# Patient Record
Sex: Male | Born: 1950
Health system: Southern US, Community
[De-identification: ages and names within clinical notes are randomized; demographics above are authoritative.]

## PROBLEM LIST (undated history)

## (undated) ENCOUNTER — Emergency Department: Admission: EM | Payer: Medicare Other

## (undated) DIAGNOSIS — M5030 Other cervical disc degeneration, unspecified cervical region: Secondary | ICD-10-CM

## (undated) DIAGNOSIS — G5690 Unspecified mononeuropathy of unspecified upper limb: Secondary | ICD-10-CM

## (undated) DIAGNOSIS — N486 Induration penis plastica: Secondary | ICD-10-CM

## (undated) DIAGNOSIS — F32A Depression, unspecified: Secondary | ICD-10-CM

## (undated) DIAGNOSIS — F329 Major depressive disorder, single episode, unspecified: Secondary | ICD-10-CM

## (undated) DIAGNOSIS — R0789 Other chest pain: Secondary | ICD-10-CM

## (undated) DIAGNOSIS — R350 Frequency of micturition: Secondary | ICD-10-CM

## (undated) DIAGNOSIS — F410 Panic disorder [episodic paroxysmal anxiety] without agoraphobia: Secondary | ICD-10-CM

## (undated) DIAGNOSIS — F39 Unspecified mood [affective] disorder: Secondary | ICD-10-CM

## (undated) DIAGNOSIS — N400 Enlarged prostate without lower urinary tract symptoms: Secondary | ICD-10-CM

## (undated) DIAGNOSIS — K621 Rectal polyp: Secondary | ICD-10-CM

## (undated) DIAGNOSIS — L723 Sebaceous cyst: Secondary | ICD-10-CM

## (undated) DIAGNOSIS — Z8619 Personal history of other infectious and parasitic diseases: Secondary | ICD-10-CM

## (undated) DIAGNOSIS — G8929 Other chronic pain: Secondary | ICD-10-CM

## (undated) DIAGNOSIS — R339 Retention of urine, unspecified: Secondary | ICD-10-CM

## (undated) DIAGNOSIS — N529 Male erectile dysfunction, unspecified: Secondary | ICD-10-CM

## (undated) HISTORY — PX: HERNIA REPAIR: SHX51

## (undated) HISTORY — DX: Unspecified mononeuropathy of unspecified upper limb: G56.90

## (undated) HISTORY — DX: Male erectile dysfunction, unspecified: N52.9

## (undated) HISTORY — DX: Other chronic pain: G89.29

## (undated) HISTORY — DX: Induration penis plastica: N48.6

## (undated) HISTORY — DX: Unspecified mood (affective) disorder: F39

## (undated) HISTORY — DX: Panic disorder (episodic paroxysmal anxiety): F41.0

## (undated) HISTORY — DX: Retention of urine, unspecified: R33.9

## (undated) HISTORY — DX: Other chest pain: R07.89

## (undated) HISTORY — DX: Other cervical disc degeneration, unspecified cervical region: M50.30

## (undated) HISTORY — DX: Major depressive disorder, single episode, unspecified: F32.9

## (undated) HISTORY — DX: Sebaceous cyst: L72.3

## (undated) HISTORY — DX: Depression, unspecified: F32.A

## (undated) HISTORY — DX: Rectal polyp: K62.1

## (undated) HISTORY — DX: Benign prostatic hyperplasia without lower urinary tract symptoms: N40.0

## (undated) HISTORY — DX: Personal history of other infectious and parasitic diseases: Z86.19

## (undated) HISTORY — PX: APPENDECTOMY: SHX54

## (undated) HISTORY — DX: Frequency of micturition: R35.0

## (undated) HISTORY — PX: CERVICAL SPINE SURGERY: SHX589

---

## 2007-04-27 ENCOUNTER — Emergency Department: Payer: Self-pay

## 2008-10-04 ENCOUNTER — Ambulatory Visit: Payer: Self-pay | Admitting: General Practice

## 2010-12-01 ENCOUNTER — Inpatient Hospital Stay: Payer: Self-pay | Admitting: Surgery

## 2010-12-02 ENCOUNTER — Inpatient Hospital Stay: Payer: Self-pay | Admitting: Surgery

## 2010-12-03 LAB — PATHOLOGY REPORT

## 2012-02-17 ENCOUNTER — Ambulatory Visit: Payer: Self-pay | Admitting: Family

## 2012-02-17 LAB — BASIC METABOLIC PANEL
Anion Gap: 6 — ABNORMAL LOW (ref 7–16)
BUN: 12 mg/dL (ref 7–18)
Chloride: 105 mmol/L (ref 98–107)
Creatinine: 0.9 mg/dL (ref 0.60–1.30)
EGFR (African American): >60
EGFR (Non-African Amer.): >60
Potassium: 4 mmol/L (ref 3.5–5.1)
Sodium: 139 mmol/L (ref 136–145)

## 2012-02-17 LAB — CBC WITH DIFFERENTIAL/PLATELET
Basophil #: 0 10*3/uL (ref 0.0–0.1)
Basophil %: 0.5 %
Eosinophil #: 0.2 10*3/uL (ref 0.0–0.7)
Eosinophil %: 2.6 %
HGB: 13.5 g/dL (ref 13.0–18.0)
Lymphocyte %: 31.8 %
MCH: 27.8 pg (ref 26.0–34.0)
MCHC: 32.9 g/dL (ref 32.0–36.0)
MCV: 85 fL (ref 80–100)
Monocyte #: 0.7 x10 3/mm (ref 0.2–1.0)
Monocyte %: 8.3 %
Neutrophil #: 4.9 10*3/uL (ref 1.4–6.5)
Neutrophil %: 56.8 %
Platelet: 283 10*3/uL (ref 150–440)
RBC: 4.85 10*6/uL (ref 4.40–5.90)
WBC: 8.7 10*3/uL (ref 3.8–10.6)

## 2012-02-17 LAB — LIPID PANEL
HDL Cholesterol: 50 mg/dL (ref 40–60)
Triglycerides: 101 mg/dL (ref 0–200)

## 2012-02-18 LAB — PSA: PSA: 1.4 ng/mL (ref 0.0–4.0)

## 2012-05-15 ENCOUNTER — Emergency Department: Payer: Self-pay | Admitting: Emergency Medicine

## 2012-09-03 ENCOUNTER — Emergency Department: Payer: Self-pay | Admitting: Unknown Physician Specialty

## 2012-09-03 LAB — COMPREHENSIVE METABOLIC PANEL
Albumin: 3.1 g/dL — ABNORMAL LOW (ref 3.4–5.0)
Anion Gap: 9 (ref 7–16)
BUN: 13 mg/dL (ref 7–18)
Bilirubin,Total: 0.2 mg/dL (ref 0.2–1.0)
Calcium, Total: 8.2 mg/dL — ABNORMAL LOW (ref 8.5–10.1)
EGFR (African American): >60
Glucose: 98 mg/dL (ref 65–99)
Potassium: 3.8 mmol/L (ref 3.5–5.1)
SGOT(AST): 45 U/L — ABNORMAL HIGH (ref 15–37)
SGPT (ALT): 62 U/L (ref 12–78)
Sodium: 140 mmol/L (ref 136–145)
Total Protein: 6.7 g/dL (ref 6.4–8.2)

## 2012-09-03 LAB — TROPONIN I: Troponin-I: <0.02 ng/mL

## 2012-09-03 LAB — CBC
MCHC: 33.4 g/dL (ref 32.0–36.0)
Platelet: 184 10*3/uL (ref 150–440)
RDW: 15.9 % — ABNORMAL HIGH (ref 11.5–14.5)
WBC: 8.1 10*3/uL (ref 3.8–10.6)

## 2012-12-12 DIAGNOSIS — F39 Unspecified mood [affective] disorder: Secondary | ICD-10-CM

## 2012-12-12 DIAGNOSIS — G5690 Unspecified mononeuropathy of unspecified upper limb: Secondary | ICD-10-CM

## 2012-12-12 DIAGNOSIS — M5030 Other cervical disc degeneration, unspecified cervical region: Secondary | ICD-10-CM

## 2012-12-12 HISTORY — DX: Unspecified mood (affective) disorder: F39

## 2012-12-12 HISTORY — DX: Unspecified mononeuropathy of unspecified upper limb: G56.90

## 2012-12-12 HISTORY — DX: Other cervical disc degeneration, unspecified cervical region: M50.30

## 2013-04-18 DIAGNOSIS — F410 Panic disorder [episodic paroxysmal anxiety] without agoraphobia: Secondary | ICD-10-CM

## 2013-04-18 HISTORY — DX: Panic disorder (episodic paroxysmal anxiety): F41.0

## 2013-04-28 IMAGING — CT CT HEAD WITHOUT CONTRAST
1 series · 16 of 30 positions shown, 20 images · non-contrast
Comparison: none

REASON FOR EXAM: syncope
COMMENTS:

PROCEDURE:     CT  - CT HEAD WITHOUT CONTRAST  - September 03, 2012 [DATE]
RESULT:     Comparison:  None
TECHNIQUE: Multiple axial images from the foramen magnum to the vertex were
obtained without IV contrast.

[Series 2: soft tissue · axial · 0.42mm/px · z∈[+246,+380]mm · 16 of 30 slices shown, 20 images]
[im 2/30  brain]
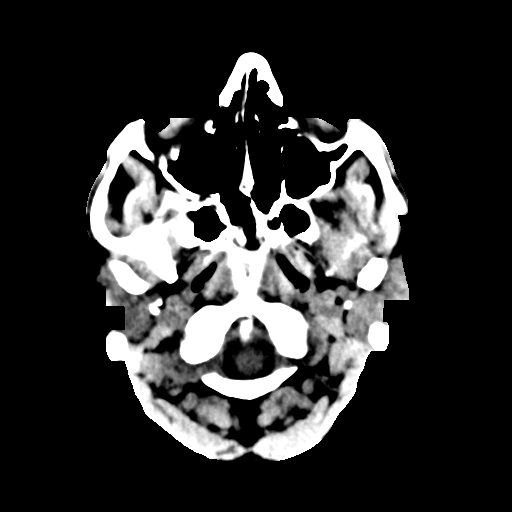
[im 2/30  bone]
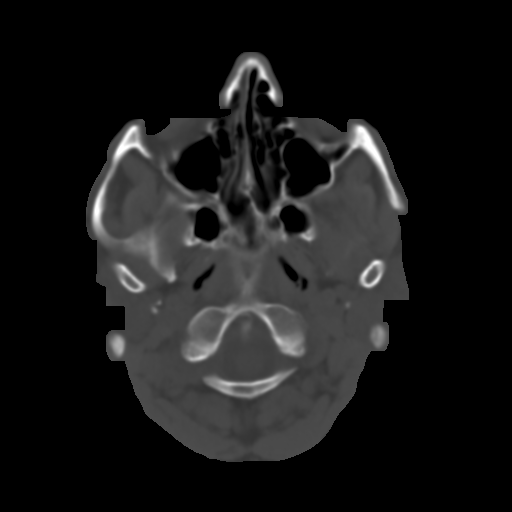
[im 4/30  brain]
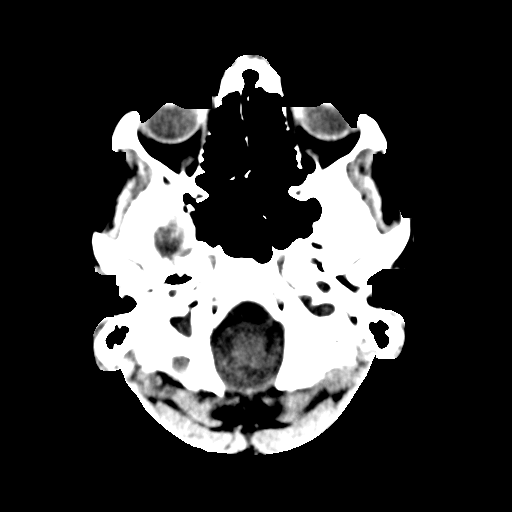
[im 6/30  brain]
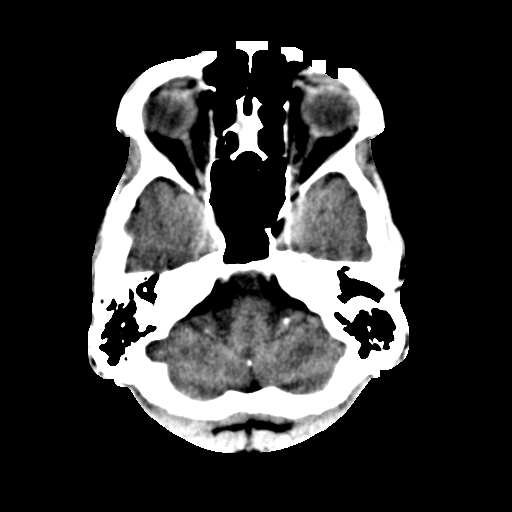
[im 8/30  brain]
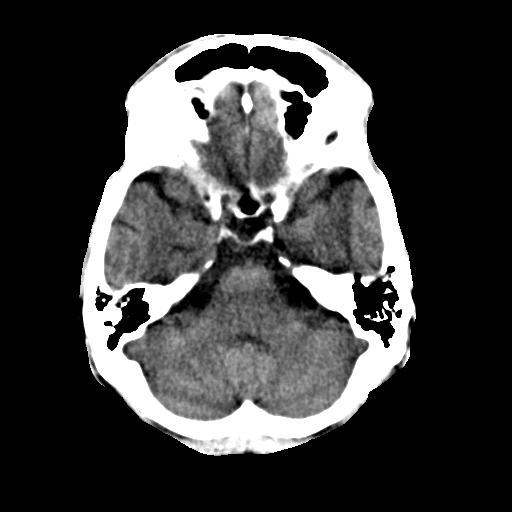
[im 9/30  brain]
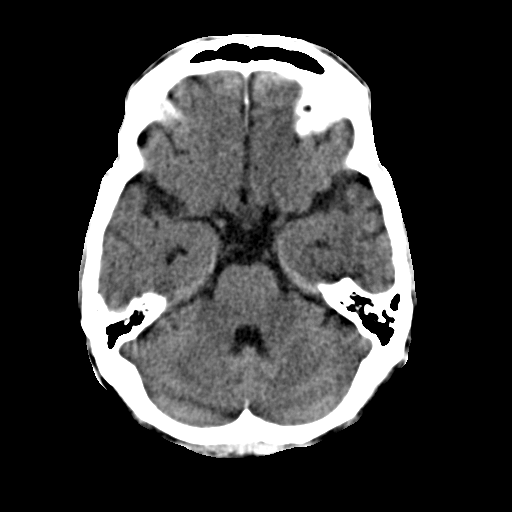
[im 9/30  bone]
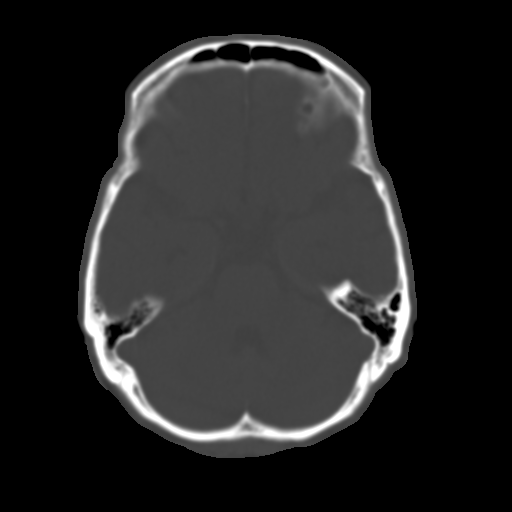
[im 11/30  brain]
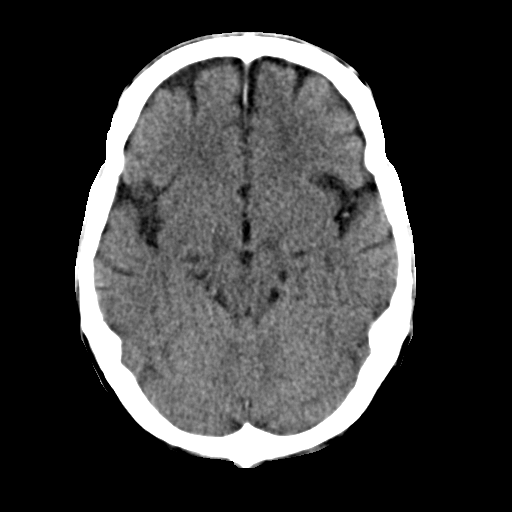
[im 13/30  brain]
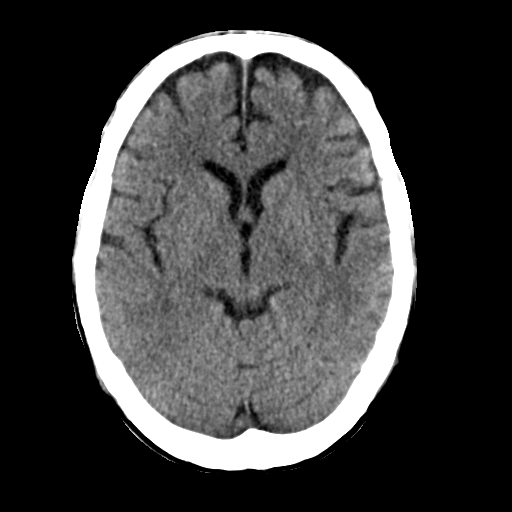
[im 15/30  brain]
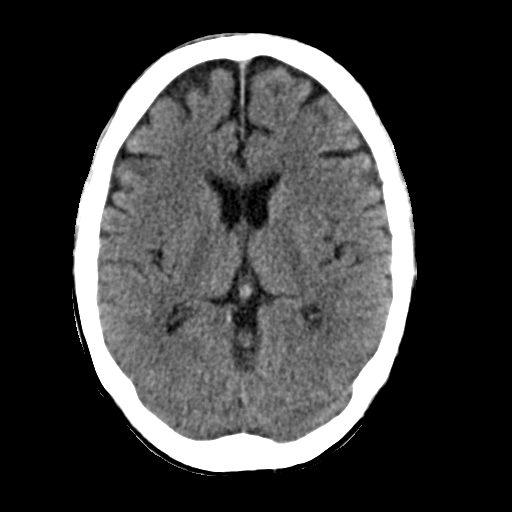
[im 16/30  brain]
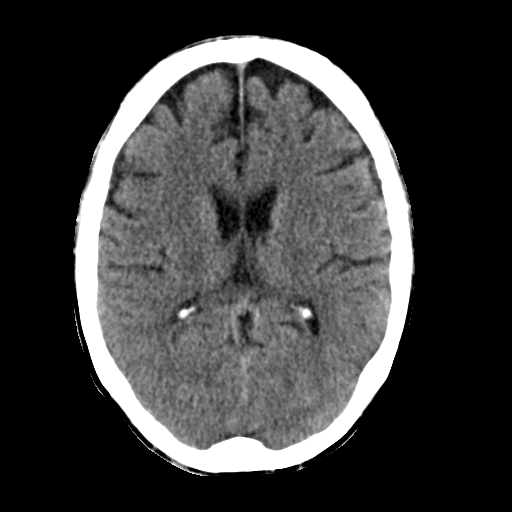
[im 16/30  bone]
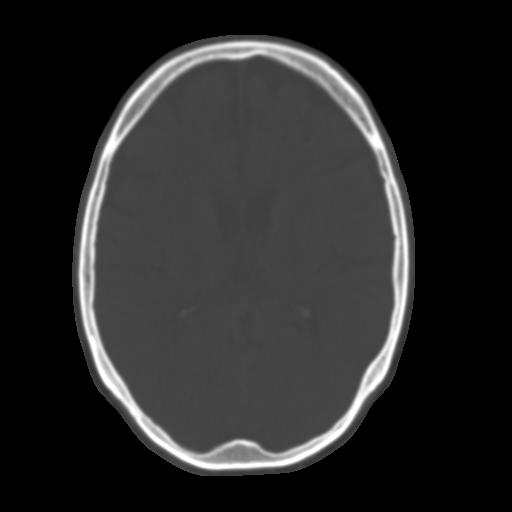
[im 18/30  brain]
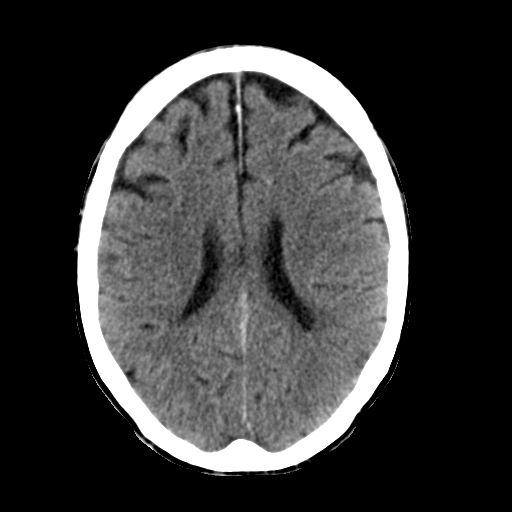
[im 20/30  brain]
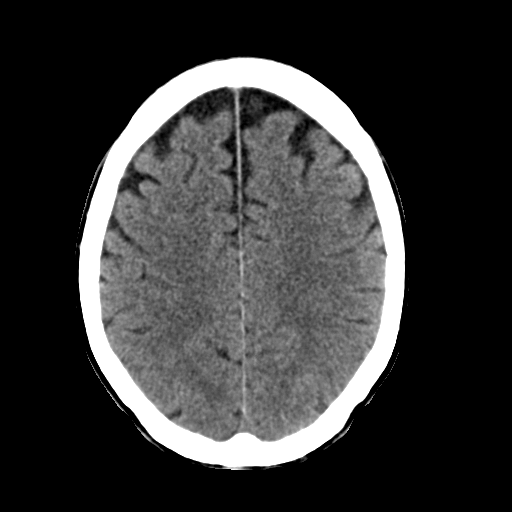
[im 22/30  brain]
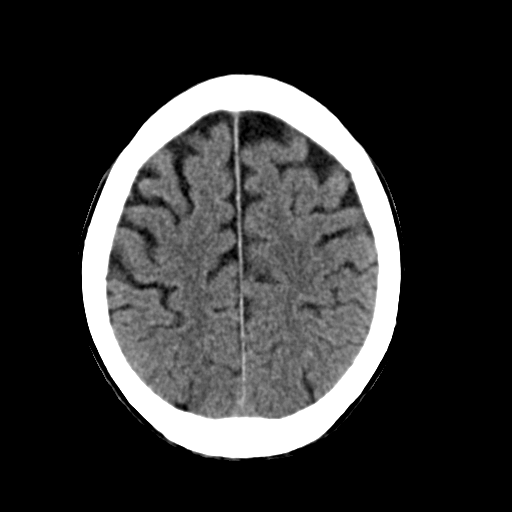
[im 23/30  brain]
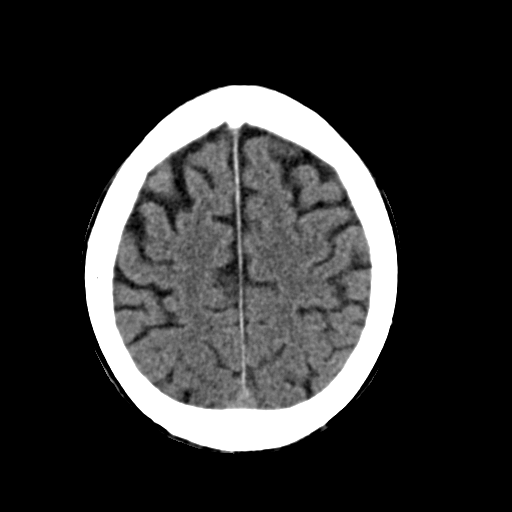
[im 23/30  bone]
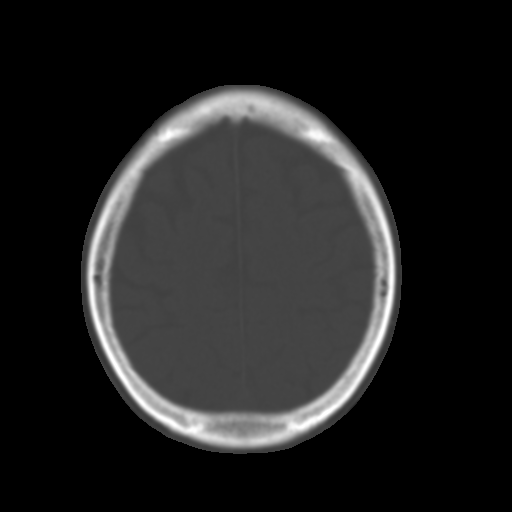
[im 25/30  brain]
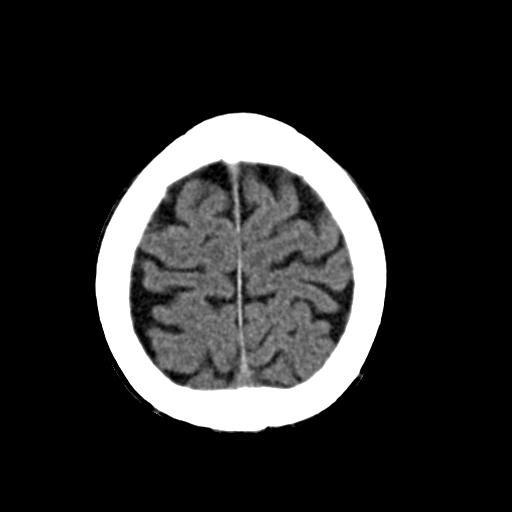
[im 27/30  brain]
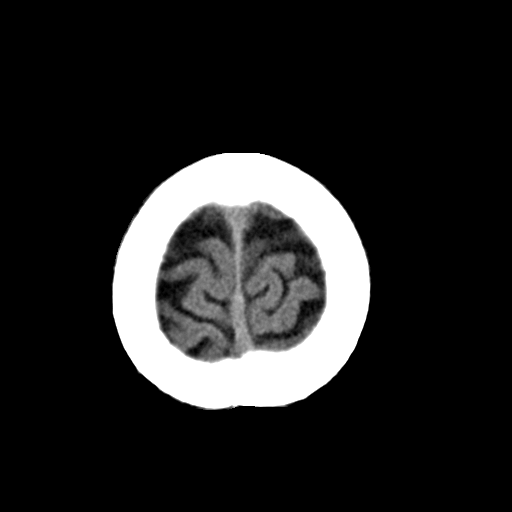
[im 29/30  brain]
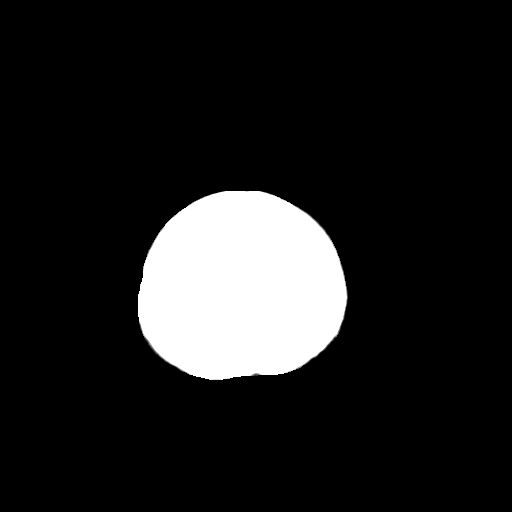

[16 of 30 positions shown; findings below may reference images not displayed]

FINDINGS: There is no evidence of mass effect, midline shift, or extra-axial fluid
collections.  There is no evidence of a space-occupying lesion or
intracranial hemorrhage. There is no evidence of a cortical-based area of
acute infarction. There is generalized cerebral atrophy.

The ventricles and sulci are appropriate for the patient's age. The basal
cisterns are patent.

Visualized portions of the orbits are unremarkable. The visualized portions
of the paranasal sinuses and mastoid air cells are unremarkable.

The osseous structures are unremarkable.
IMPRESSION: No acute intracranial process.

[REDACTED]

## 2013-07-10 DIAGNOSIS — F329 Major depressive disorder, single episode, unspecified: Secondary | ICD-10-CM | POA: Insufficient documentation

## 2013-07-10 HISTORY — DX: Major depressive disorder, single episode, unspecified: F32.9

## 2014-01-23 ENCOUNTER — Encounter: Payer: Self-pay | Admitting: General Surgery

## 2014-01-31 ENCOUNTER — Ambulatory Visit: Payer: Self-pay | Admitting: Gastroenterology

## 2014-02-04 ENCOUNTER — Ambulatory Visit: Payer: Self-pay | Admitting: General Surgery

## 2014-02-21 ENCOUNTER — Ambulatory Visit: Payer: Self-pay | Admitting: Gastroenterology

## 2014-02-21 LAB — CBC WITH DIFFERENTIAL/PLATELET
BASOS PCT: 1.2 %
Basophil #: 0.1 10*3/uL (ref 0.0–0.1)
Eosinophil #: 0.3 10*3/uL (ref 0.0–0.7)
Eosinophil %: 2.7 %
HCT: 38.1 % — ABNORMAL LOW (ref 40.0–52.0)
HGB: 11.9 g/dL — AB (ref 13.0–18.0)
LYMPHS PCT: 36.8 %
Lymphocyte #: 3.7 10*3/uL — ABNORMAL HIGH (ref 1.0–3.6)
MCH: 21.9 pg — AB (ref 26.0–34.0)
MCHC: 31.1 g/dL — AB (ref 32.0–36.0)
MCV: 70 fL — ABNORMAL LOW (ref 80–100)
Monocyte #: 0.8 x10 3/mm (ref 0.2–1.0)
Monocyte %: 8.3 %
NEUTROS ABS: 5.1 10*3/uL (ref 1.4–6.5)
NEUTROS PCT: 51 %
Platelet: 295 10*3/uL (ref 150–440)
RBC: 5.41 10*6/uL (ref 4.40–5.90)
RDW: 18.9 % — AB (ref 11.5–14.5)
WBC: 10 10*3/uL (ref 3.8–10.6)

## 2014-02-21 LAB — HEPATIC FUNCTION PANEL A (ARMC)
ALK PHOS: 66 U/L
Albumin: 4 g/dL (ref 3.4–5.0)
Bilirubin, Direct: <0.05 mg/dL (ref 0.00–0.20)
Bilirubin,Total: 0.4 mg/dL (ref 0.2–1.0)
SGOT(AST): 37 U/L (ref 15–37)
SGPT (ALT): 62 U/L (ref 12–78)
Total Protein: 8.2 g/dL (ref 6.4–8.2)

## 2014-02-21 LAB — PROTIME-INR
INR: 0.8
Prothrombin Time: 11.4 secs — ABNORMAL LOW (ref 11.5–14.7)

## 2014-02-21 LAB — IRON AND TIBC
Iron Bind.Cap.(Total): 669 ug/dL — ABNORMAL HIGH (ref 250–450)
Iron Saturation: 3 %
Iron: 23 ug/dL — ABNORMAL LOW (ref 65–175)
UNBOUND IRON-BIND. CAP.: 646 ug/dL

## 2014-02-21 LAB — FERRITIN: FERRITIN (ARMC): 9 ng/mL (ref 8–388)

## 2014-02-22 ENCOUNTER — Ambulatory Visit: Payer: Self-pay | Admitting: Gastroenterology

## 2014-05-14 ENCOUNTER — Ambulatory Visit: Payer: Self-pay | Admitting: Gastroenterology

## 2014-05-14 LAB — HEPATIC FUNCTION PANEL A (ARMC)
ALBUMIN: 4.1 g/dL (ref 3.4–5.0)
AST: 14 U/L — AB (ref 15–37)
Alkaline Phosphatase: 58 U/L
BILIRUBIN DIRECT: 0.1 mg/dL (ref 0.00–0.20)
BILIRUBIN TOTAL: 0.3 mg/dL (ref 0.2–1.0)
SGPT (ALT): 18 U/L (ref 12–78)
TOTAL PROTEIN: 8 g/dL (ref 6.4–8.2)

## 2014-09-17 ENCOUNTER — Ambulatory Visit: Payer: Self-pay | Admitting: Family Medicine

## 2014-10-31 DIAGNOSIS — N486 Induration penis plastica: Secondary | ICD-10-CM | POA: Diagnosis not present

## 2014-11-27 DIAGNOSIS — N486 Induration penis plastica: Secondary | ICD-10-CM | POA: Diagnosis not present

## 2015-01-14 DIAGNOSIS — N400 Enlarged prostate without lower urinary tract symptoms: Secondary | ICD-10-CM | POA: Diagnosis not present

## 2015-01-14 DIAGNOSIS — N529 Male erectile dysfunction, unspecified: Secondary | ICD-10-CM | POA: Diagnosis not present

## 2015-02-11 DIAGNOSIS — F0630 Mood disorder due to known physiological condition, unspecified: Secondary | ICD-10-CM | POA: Diagnosis not present

## 2015-02-11 DIAGNOSIS — M5030 Other cervical disc degeneration, unspecified cervical region: Secondary | ICD-10-CM | POA: Diagnosis not present

## 2015-02-11 DIAGNOSIS — G5692 Unspecified mononeuropathy of left upper limb: Secondary | ICD-10-CM | POA: Diagnosis not present

## 2015-03-07 DIAGNOSIS — N486 Induration penis plastica: Secondary | ICD-10-CM | POA: Diagnosis not present

## 2015-04-02 ENCOUNTER — Telehealth: Payer: Self-pay | Admitting: Urology

## 2015-04-02 NOTE — Telephone Encounter (Signed)
Spoke with pt wife in reference to pt soreness. Reinforced to wife during the xiaflex procedure time frame soreness is to be expected. Wife voiced understanding. Cw,lpn

## 2015-04-02 NOTE — Telephone Encounter (Signed)
Called pt to reschedule 2 appointments in June with Dr.Hart (6/24 &6/27). Pts then informed me pain. Not in the penis but in the hairlines of the penis. Says its sore like a bruise. He's wanting to know if this is normal since this is his 3rd injection. Best contact # [phone] [facility] 04/02/15

## 2015-04-29 ENCOUNTER — Encounter: Payer: Self-pay | Admitting: Urology

## 2015-04-29 ENCOUNTER — Ambulatory Visit: Payer: Self-pay | Admitting: Urology

## 2015-04-29 ENCOUNTER — Ambulatory Visit (INDEPENDENT_AMBULATORY_CARE_PROVIDER_SITE_OTHER): Payer: Self-pay | Admitting: Urology

## 2015-04-29 VITALS — BP 135/82 | HR 87 | Ht 67.0 in | Wt 128.0 lb

## 2015-04-29 DIAGNOSIS — N529 Male erectile dysfunction, unspecified: Secondary | ICD-10-CM

## 2015-04-29 DIAGNOSIS — N528 Other male erectile dysfunction: Secondary | ICD-10-CM

## 2015-04-29 MED ORDER — SILDENAFIL CITRATE 100 MG PO TABS: 100.0000 mg | ORAL_TABLET | ORAL | Status: DC | PRN

## 2015-04-29 NOTE — Progress Notes (Signed)
04/29/2015 5:15 PM   Dalton Fleming 03/18/1951 [id number]  Referring provider: No referring provider defined for this encounter.  Chief Complaint  Patient presents with  . Abnormal Penile Curvature    follow up    HPI: Patient had 1 treatment with X IIA FLEX approximately 6 weeks ago. The interim he is done molding. His wife run a picture of his erect penis today. Penis was perfectly straight and no longer had a 30 bend which she had had prior to the treatment with the X IIA FLEX'and molding. Patient still complains of inability ordered sildenafil E milligrams 1:30 minutes before intercourse. This generic should cost him less than Viagra she does not have the money to pay for. He is complaining of inability to hold erection and retrograde ejaculation. I will stop his Flomax and continue his finasteride stopping Flomax should help him with his retrograde ejaculation   PMH: No past medical history on file.  Surgical History: No past surgical history on file.  Home Medications:    Medication List       This list is accurate as of: 04/29/15  5:15 PM.  Always use your most recent med list.               ALPRAZolam 1 MG tablet  Commonly known as:  XANAX  Take 1 tablet every 6 hours as needed for anxiety and sleep. Max 3 tabs a day     aspirin EC 81 MG tablet  Take by mouth.     finasteride 5 MG tablet  Commonly known as:  PROSCAR     HYDROmorphone 4 MG tablet  Commonly known as:  DILAUDID  1 tab Q 6 hrs, 4 per day     lamoTRIgine 100 MG tablet  Commonly known as:  LAMICTAL  1 po Q AM and Q PM     meloxicam 15 MG tablet  Commonly known as:  MOBIC     mirtazapine 15 MG tablet  Commonly known as:  REMERON  1/2 tab BID and 1 tab HS, 2 per day     polyethylene glycol powder powder  Commonly known as:  GLYCOLAX/MIRALAX  Take by mouth.     sertraline 100 MG tablet  Commonly known as:  ZOLOFT  Take by mouth.     SUPREP BOWEL PREP Soln  Generic drug:  Na  Sulfate-K Sulfate-Mg Sulf     tadalafil 5 MG tablet  Commonly known as:  CIALIS  Take by mouth.     tamsulosin 0.4 MG Caps capsule  Commonly known as:  FLOMAX  Take by mouth.        Allergies: Not on File  Family History: No family history on file.  Social History:  reports that he has been smoking.  He does not have any smokeless tobacco history on file. His alcohol and drug histories are not on file.  NWG:NFAOZHYROS:UROLOGY Frequent Urination?: No Hard to postpone urination?: No Burning/pain with urination?: No Get up at night to urinate?: Yes Leakage of urine?: No Urine stream starts and stops?: Yes Trouble starting stream?: Yes Do you have to strain to urinate?: No Blood in urine?: No Urinary tract infection?: No Sexually transmitted disease?: No Injury to kidneys or bladder?: No Painful intercourse?: No Weak stream?: No Erection problems?: Yes Penile pain?: No Gastrointestinal Nausea?: No Vomiting?: No Indigestion/heartburn?: No Diarrhea?: No Constipation?: No Constitutional Fever: No Night sweats?: No Weight loss?: No Fatigue?: No Skin Skin rash/lesions?: No Itching?: No Eyes Blurred vision?:  No Double vision?: No Ears/Nose/Throat Sore throat?: No Sinus problems?: No Hematologic/Lymphatic Swollen glands?: No Easy bruising?: No Cardiovascular Leg swelling?: No Chest pain?: No Respiratory Cough?: No Shortness of breath?: No Endocrine Excessive thirst?: No Musculoskeletal Back pain?: Yes Joint pain?: No Neurological Headaches?: No Dizziness?: No Psychologic Depression?: Yes Anxiety?: Yes    Urological Symptom Review  Patient is experiencing the following symptoms:       Physical Exam: BP 135/82 mmHg  Pulse 87  Ht  (1.702 m)  Wt 128 lb (58.06 kg)  BMI 20.04 kg/m2  Constitutional:  Alert and oriented, No acute distress. HEENT: [place] AT, moist mucus membranes.  Trachea midline, no masses. Cardiovascular: No clubbing, cyanosis, or  edema. Respiratory: Normal respiratory effort, no increased work of breathing. GI: Abdomen is soft, nontender, nondistended, no abdominal masses GU: No CVA tenderness. By picture and normal erection Skin: No rashes, bruises or suspicious lesions. Lymph: No cervical or inguinal adenopathy. Neurologic: Grossly intact, no focal deficits, moving all 4 extremities. Psychiatric: Normal mood and affect.  Laboratory Data: Lab Results  Component Value Date   WBC 10.0 02/21/2014   HGB 11.9* 02/21/2014   HCT 38.1* 02/21/2014   MCV 70* 02/21/2014   PLT 295 02/21/2014    Lab Results  Component Value Date   CREATININE 0.81 09/03/2012    Lab Results  Component Value Date   PSA 1.4 02/17/2012    No results found for: TESTOSTERONE  No results found for: HGBA1C  Urinalysis No results found for: COLORURINE, APPEARANCEUR, LABSPEC, PHURINE, GLUCOSEU, HGBUR, BILIRUBINUR, KETONESUR, PROTEINUR, UROBILINOGEN, NITRITE, LEUKOCYTESUR  Pertinent Imaging: None  Assessment & Plan:  Peroneal disease resolved. No further XIAFLEX injections necessary. Follow-up is in 3 months.  There are no diagnoses linked to this encounter.  No Follow-up on file.  Lorraine Lax, MD  [facility] [address] [place], [place] [address] [phone]

## 2015-04-30 ENCOUNTER — Other Ambulatory Visit: Payer: Self-pay | Admitting: Family Medicine

## 2015-04-30 DIAGNOSIS — N529 Male erectile dysfunction, unspecified: Secondary | ICD-10-CM

## 2015-04-30 MED ORDER — SILDENAFIL CITRATE 20 MG PO TABS: ORAL_TABLET | ORAL | Status: DC

## 2015-05-09 ENCOUNTER — Ambulatory Visit: Payer: Self-pay | Admitting: Urology

## 2015-05-12 ENCOUNTER — Ambulatory Visit: Payer: Self-pay | Admitting: Urology

## 2015-05-13 ENCOUNTER — Ambulatory Visit: Payer: Self-pay | Admitting: Urology

## 2015-05-13 DIAGNOSIS — M5030 Other cervical disc degeneration, unspecified cervical region: Secondary | ICD-10-CM | POA: Diagnosis not present

## 2015-05-13 DIAGNOSIS — Z79891 Long term (current) use of opiate analgesic: Secondary | ICD-10-CM | POA: Diagnosis not present

## 2015-05-13 DIAGNOSIS — G5692 Unspecified mononeuropathy of left upper limb: Secondary | ICD-10-CM | POA: Diagnosis not present

## 2015-05-13 DIAGNOSIS — F331 Major depressive disorder, recurrent, moderate: Secondary | ICD-10-CM | POA: Diagnosis not present

## 2015-05-14 ENCOUNTER — Ambulatory Visit: Payer: Self-pay | Admitting: Urology

## 2015-05-16 ENCOUNTER — Ambulatory Visit: Payer: Self-pay | Admitting: Urology

## 2015-06-03 ENCOUNTER — Ambulatory Visit (INDEPENDENT_AMBULATORY_CARE_PROVIDER_SITE_OTHER): Payer: Medicare Other | Admitting: Urology

## 2015-06-03 ENCOUNTER — Encounter: Payer: Self-pay | Admitting: Urology

## 2015-06-03 VITALS — BP 123/73 | HR 81 | Ht 67.0 in | Wt 137.6 lb

## 2015-06-03 DIAGNOSIS — N486 Induration penis plastica: Secondary | ICD-10-CM | POA: Diagnosis not present

## 2015-06-03 NOTE — Progress Notes (Signed)
06/03/2015 11:10 AM   Dalton Fleming Dalton Fleming 11-16-1950 [id number]  Referring provider: No referring provider defined for this encounter.  Chief Complaint  Patient presents with  . Erectile Dysfunction    HPI: Patient has recurrence of 30 bend with significant upward traction. He needs another treatment. He had been straight 2 months this is a recent recurrence. Patient also complains of some difficulty with orgasm even though he can maintain an erection for some time. He does not ejaculate because he has no orgasm he has tried multiple stimulated over-the-counter products. These have not been successful HPI   PMH: Past Medical History  Diagnosis Date  . Depression   . Hx of hepatitis C   . BPH (benign prostatic hyperplasia)   . Peyronie's disease   . Inability to urinate   . Impotence   . Urinary frequency     Surgical History: Past Surgical History  Procedure Laterality Date  . Appendectomy    . Cervical spine surgery    . Hernia repair      Home Medications:    Medication List       This list is accurate as of: 06/03/15 11:10 AM.  Always use your most recent med list.               ALPRAZolam 1 MG tablet  Commonly known as:  XANAX  Take 1 tablet every 6 hours as needed for anxiety and sleep. Max 3 tabs a day     aspirin EC 81 MG tablet  Take by mouth.     finasteride 5 MG tablet  Commonly known as:  PROSCAR     HYDROmorphone 4 MG tablet  Commonly known as:  DILAUDID  1 tab Q 6 hrs, 4 per day     lamoTRIgine 100 MG tablet  Commonly known as:  LAMICTAL  1 po Q AM and Q PM     meloxicam 15 MG tablet  Commonly known as:  MOBIC     mirtazapine 15 MG tablet  Commonly known as:  REMERON  1/2 tab BID and 1 tab HS, 2 per day     polyethylene glycol powder powder  Commonly known as:  GLYCOLAX/MIRALAX  Take by mouth.     sertraline 100 MG tablet  Commonly known as:  ZOLOFT  Take by mouth.     sildenafil 100 MG tablet  Commonly known as:  VIAGRA    Take 1 tablet (100 mg total) by mouth as needed for erectile dysfunction (1/2 tab).     SUPREP BOWEL PREP Soln  Generic drug:  Na Sulfate-K Sulfate-Mg Sulf        Allergies:  Allergies  Allergen Reactions  . Codeine Rash    Other reaction(s): Vomiting    Family History: Family History  Problem Relation Age of Onset  . Heart disease Father   . Breast cancer Sister   . Prostate cancer Neg Hx     Social History:  reports that he has been smoking.  He does not have any smokeless tobacco history on file. He reports that he does not drink alcohol or use illicit drugs.  ROS: UROLOGY Frequent Urination?: No Hard to postpone urination?: No Burning/pain with urination?: No Get up at night to urinate?: Yes Leakage of urine?: No Urine stream starts and stops?: No Trouble starting stream?: No Do you have to strain to urinate?: Yes Blood in urine?: No Urinary tract infection?: No Sexually transmitted disease?: No Injury to kidneys or bladder?: No  Painful intercourse?: No Weak stream?: No Erection problems?: Yes Penile pain?: No  Gastrointestinal Nausea?: No Vomiting?: No Indigestion/heartburn?: No Diarrhea?: No Constipation?: No  Constitutional Fever: No Night sweats?: No Weight loss?: No Fatigue?: No  Skin Skin rash/lesions?: No Itching?: No  Eyes Blurred vision?: No Double vision?: No  Ears/Nose/Throat Sore throat?: No Sinus problems?: No  Hematologic/Lymphatic Swollen glands?: No Easy bruising?: No  Cardiovascular Leg swelling?: No Chest pain?: No  Respiratory Cough?: No Shortness of breath?: No  Endocrine Excessive thirst?: No  Musculoskeletal Back pain?: Yes Joint pain?: No  Neurological Headaches?: No Dizziness?: No  Psychologic Depression?: Yes Anxiety?: Yes  Physical Exam: BP 123/73 mmHg  Pulse 81  Ht 5\' 7"  (1.702 m)  Wt 137 lb 9.6 oz (62.415 kg)  BMI 21.55 kg/m2  Constitutional:  Alert and oriented, No acute  distress. HEENT: [place] AT, moist mucus membranes.  Trachea midline, no masses. Cardiovascular: No clubbing, cyanosis, or edema. Respiratory: Normal respiratory effort, no increased work of breathing. GI: Abdomen is soft, nontender, nondistended, no abdominal masses GU: No CVA tenderness. Patient has a normal-appearing flaccid penis but the corpora shows the plaque Skin: No rashes, bruises or suspicious lesions. Lymph: No cervical or inguinal adenopathy. Neurologic: Grossly intact, no focal deficits, moving all 4 extremities. Psychiatric: Normal mood and affect.  Laboratory Data: Lab Results  Component Value Date   WBC 10.0 02/21/2014   HGB 11.9* 02/21/2014   HCT 38.1* 02/21/2014   MCV 70* 02/21/2014   PLT 295 02/21/2014    Lab Results  Component Value Date   CREATININE 0.81 09/03/2012    Lab Results  Component Value Date   PSA 1.4 02/17/2012    No results found for: TESTOSTERONE  No results found for: HGBA1C  Urinalysis No results found for: COLORURINE, APPEARANCEUR, LABSPEC, PHURINE, GLUCOSEU, HGBUR, BILIRUBINUR, KETONESUR, PROTEINUR, UROBILINOGEN, NITRITE, LEUKOCYTESUR  Pertinent Imaging: Reviewed picture of patient's prone is disease. This was taken by his wife shows a 30 upward bend which is no different than previous bend. She did not save the picture showing a straight penis. Assessment and Plan: Peyroniesdisease with significant 30 ventral bend with significant PEYONIES  recurrence plan is X IIA FLEX that his  treatment. We'll set      Problem List Items Addressed This Visit    None      No Follow-up on file.  Lorraine Lax, MD  [facility] [address], [address] [place], [place] [address] [phone]

## 2015-06-16 ENCOUNTER — Other Ambulatory Visit: Payer: Self-pay | Admitting: Family Medicine

## 2015-06-16 MED ORDER — MELOXICAM 15 MG PO TABS: 15.0000 mg | ORAL_TABLET | Freq: Every day | ORAL | Status: DC

## 2015-08-06 DIAGNOSIS — F418 Other specified anxiety disorders: Secondary | ICD-10-CM | POA: Diagnosis not present

## 2015-08-06 DIAGNOSIS — F5105 Insomnia due to other mental disorder: Secondary | ICD-10-CM | POA: Diagnosis not present

## 2015-08-06 DIAGNOSIS — F331 Major depressive disorder, recurrent, moderate: Secondary | ICD-10-CM | POA: Diagnosis not present

## 2015-08-06 DIAGNOSIS — F410 Panic disorder [episodic paroxysmal anxiety] without agoraphobia: Secondary | ICD-10-CM | POA: Diagnosis not present

## 2015-08-06 DIAGNOSIS — M5030 Other cervical disc degeneration, unspecified cervical region: Secondary | ICD-10-CM | POA: Diagnosis not present

## 2015-08-06 DIAGNOSIS — G5692 Unspecified mononeuropathy of left upper limb: Secondary | ICD-10-CM | POA: Diagnosis not present

## 2015-10-30 DIAGNOSIS — G5692 Unspecified mononeuropathy of left upper limb: Secondary | ICD-10-CM | POA: Diagnosis not present

## 2015-10-30 DIAGNOSIS — M5030 Other cervical disc degeneration, unspecified cervical region: Secondary | ICD-10-CM | POA: Diagnosis not present

## 2015-12-25 ENCOUNTER — Other Ambulatory Visit: Payer: Self-pay

## 2015-12-25 DIAGNOSIS — N400 Enlarged prostate without lower urinary tract symptoms: Secondary | ICD-10-CM

## 2015-12-25 MED ORDER — TAMSULOSIN HCL 0.4 MG PO CAPS: 0.4000 mg | ORAL_CAPSULE | Freq: Every day | ORAL | Status: DC

## 2015-12-25 MED ORDER — FINASTERIDE 5 MG PO TABS: 5.0000 mg | ORAL_TABLET | Freq: Every day | ORAL | Status: DC

## 2015-12-25 NOTE — Progress Notes (Signed)
Pt pharmacy sent a refill request for flomax and finasteride. Made pt aware needs a yearly f/u for medications. Pt voiced understanding. Pt stated that he would call back later to make f/u appt. 30 days with no refills were given to pt.

## 2015-12-26 ENCOUNTER — Telehealth: Payer: Self-pay

## 2015-12-26 DIAGNOSIS — N400 Enlarged prostate without lower urinary tract symptoms: Secondary | ICD-10-CM

## 2015-12-26 MED ORDER — FINASTERIDE 5 MG PO TABS: 5.0000 mg | ORAL_TABLET | Freq: Every day | ORAL | Status: DC

## 2015-12-26 NOTE — Telephone Encounter (Signed)
Pt pharmacy sent a refill of finasteride. I dont see where pt was taken medication. Please advise.

## 2015-12-26 NOTE — Telephone Encounter (Signed)
Spoke with pt in reference to finasteride. Made pt aware he needs a f/u appt. Pt stated he currently has a very busy schedule and will have to call back. Made pt aware only 30 days is being given with no refills. Pt voiced understanding.

## 2015-12-26 NOTE — Telephone Encounter (Signed)
It was listed as medication in the meds list of Dr. Scheryl DarterHart's notes, but those notes were for xiaflex. Ok to give him a month and have him follow up in the interim to discuss this medication.

## 2016-01-01 DIAGNOSIS — G5692 Unspecified mononeuropathy of left upper limb: Secondary | ICD-10-CM | POA: Diagnosis not present

## 2016-01-01 DIAGNOSIS — M5030 Other cervical disc degeneration, unspecified cervical region: Secondary | ICD-10-CM | POA: Diagnosis not present

## 2016-01-01 DIAGNOSIS — F331 Major depressive disorder, recurrent, moderate: Secondary | ICD-10-CM | POA: Diagnosis not present

## 2016-01-01 DIAGNOSIS — F410 Panic disorder [episodic paroxysmal anxiety] without agoraphobia: Secondary | ICD-10-CM | POA: Diagnosis not present

## 2016-04-01 ENCOUNTER — Other Ambulatory Visit: Payer: Self-pay

## 2016-04-01 DIAGNOSIS — N400 Enlarged prostate without lower urinary tract symptoms: Secondary | ICD-10-CM

## 2016-04-01 MED ORDER — TAMSULOSIN HCL 0.4 MG PO CAPS: 0.4000 mg | ORAL_CAPSULE | Freq: Every day | ORAL | Status: DC

## 2016-04-13 DIAGNOSIS — M5030 Other cervical disc degeneration, unspecified cervical region: Secondary | ICD-10-CM | POA: Diagnosis not present

## 2016-04-13 DIAGNOSIS — F331 Major depressive disorder, recurrent, moderate: Secondary | ICD-10-CM | POA: Diagnosis not present

## 2016-04-13 DIAGNOSIS — G5692 Unspecified mononeuropathy of left upper limb: Secondary | ICD-10-CM | POA: Diagnosis not present

## 2016-05-26 ENCOUNTER — Other Ambulatory Visit: Payer: Self-pay | Admitting: Urology

## 2016-05-26 DIAGNOSIS — N400 Enlarged prostate without lower urinary tract symptoms: Secondary | ICD-10-CM

## 2016-06-09 ENCOUNTER — Encounter: Payer: Self-pay | Admitting: *Deleted

## 2016-06-09 DIAGNOSIS — L723 Sebaceous cyst: Secondary | ICD-10-CM

## 2016-06-09 DIAGNOSIS — N486 Induration penis plastica: Secondary | ICD-10-CM | POA: Insufficient documentation

## 2016-06-09 DIAGNOSIS — K621 Rectal polyp: Secondary | ICD-10-CM | POA: Insufficient documentation

## 2016-06-09 DIAGNOSIS — N400 Enlarged prostate without lower urinary tract symptoms: Secondary | ICD-10-CM | POA: Insufficient documentation

## 2016-06-09 HISTORY — DX: Rectal polyp: K62.1

## 2016-06-09 HISTORY — DX: Sebaceous cyst: L72.3

## 2016-06-10 ENCOUNTER — Encounter: Payer: Self-pay | Admitting: Family Medicine

## 2016-06-10 ENCOUNTER — Ambulatory Visit (INDEPENDENT_AMBULATORY_CARE_PROVIDER_SITE_OTHER): Payer: Medicare Other | Admitting: Family Medicine

## 2016-06-10 VITALS — BP 106/74 | HR 83 | Temp 97.8°F | Resp 16 | Ht 67.0 in | Wt 119.0 lb

## 2016-06-10 DIAGNOSIS — R0789 Other chest pain: Secondary | ICD-10-CM | POA: Diagnosis not present

## 2016-06-10 MED ORDER — NITROGLYCERIN 0.4 MG SL SUBL: 0.4000 mg | SUBLINGUAL_TABLET | SUBLINGUAL | 3 refills | Status: DC | PRN

## 2016-06-10 MED ORDER — ASPIRIN EC 81 MG PO TBEC: 81.0000 mg | DELAYED_RELEASE_TABLET | Freq: Every day | ORAL | 12 refills | Status: DC

## 2016-06-10 NOTE — Progress Notes (Signed)
Name: Dalton Fleming   MRN: [id number]    DOB: 12/26/1950   Date:06/10/2016       Progress Note  Subjective  Chief Complaint  Chief Complaint  Patient presents with  . Chest Pain    HPI Patient here after long absence (2 yrs) c/o intermittant chest pains for many years.  He states that >5 yrs ago he [facility] and was told "everything alright".  Chest pains feel like ache in Left middle lower chest.  Can last over 1 hr, but then resolves. Pains usually occur when resting.  No nausea.  No SOB.  No diaphoresis.  Not related to activity.  He has a strong family hx. of MIs on both sides of family  He is concerned that his bad teeth may be a cause for heart disease and has very bad teeth.  He takes meds for BPH.  He [facility].  He currently can urinate ok, but stream is small with decreased pressure.  No dysuria.  No hematuria.    Has occ nocturia, but usually when he misses Flomax.  More frequent urination without Flomax.  He sees Dr. Renold GentaPrakken, Psych in [place] for OCD.  Has depression also.  He takes the Dilaudid from him also for chronic pain in L shoulder.  No problem-specific Assessment & Plan notes found for this encounter.   Past Medical History:  Diagnosis Date  . BPH (benign prostatic hyperplasia)   . Depression   . Hx of hepatitis C   . Impotence   . Inability to urinate   . Peyronie's disease   . Urinary frequency     Past Surgical History:  Procedure Laterality Date  . APPENDECTOMY    . CERVICAL SPINE SURGERY    . HERNIA REPAIR      Family History  Problem Relation Age of Onset  . Heart disease Father   . Breast cancer Sister   . Leukemia Mother   . Diabetes Maternal Grandmother   . Colon cancer Paternal Grandfather   . Prostate cancer Neg Hx     Social History   Social History  . Marital status: Unknown    Spouse name: N/A  . Number of children: N/A  . Years of education: N/A   Occupational  History  . Not on file.   Social History Main Topics  . Smoking status: Light Tobacco Smoker  . Smokeless tobacco: Never Used  . Alcohol use No  . Drug use: No  . Sexual activity: Not on file   Other Topics Concern  . Not on file   Social History Narrative  . No narrative on file     Current Outpatient Prescriptions:  .  ALPRAZolam (XANAX) 1 MG tablet, Take 1 tablet every 6 hours as needed for anxiety and sleep. Max 3 tabs a day, Disp: , Rfl:  .  HYDROmorphone (DILAUDID) 4 MG tablet, 1 tab Q 6 hrs, 4 per day, Disp: , Rfl:  .  lamoTRIgine (LAMICTAL) 100 MG tablet, 1 po Q AM and Q PM, Disp: , Rfl:  .  meloxicam (MOBIC) 15 MG tablet, Take 1 tablet (15 mg total) by mouth daily., Disp: 15 tablet, Rfl: 0 .  mirtazapine (REMERON) 15 MG tablet, 1/2 tab BID and 1 tab HS, 2 per day, Disp: , Rfl:  .  sertraline (ZOLOFT) 100 MG tablet, Take by mouth., Disp: , Rfl:  .  tamsulosin (FLOMAX) 0.4 MG  CAPS capsule, Take 1 capsule (0.4 mg total) by mouth daily., Disp: 30 capsule, Rfl: 3 .  aspirin EC 81 MG tablet, Take 1 tablet (81 mg total) by mouth daily., Disp: 100 tablet, Rfl: 12 .  finasteride (PROSCAR) 5 MG tablet, Take 1 tablet (5 mg total) by mouth daily. (Patient not taking: Reported on 06/10/2016), Disp: 30 tablet, Rfl: 0 .  nitroGLYCERIN (NITROSTAT) 0.4 MG SL tablet, Place 1 tablet (0.4 mg total) under the tongue every 5 (five) minutes as needed for chest pain., Disp: 50 tablet, Rfl: 3  Allergies  Allergen Reactions  . Codeine Rash    Other reaction(s): Vomiting     Review of Systems  Constitutional: Positive for weight loss (weight up and down). Negative for chills, fever and malaise/fatigue.  HENT: Negative for hearing loss.   Eyes: Negative for blurred vision and double vision.  Respiratory: Positive for wheezing (occ.). Negative for cough and shortness of breath.   Cardiovascular: Positive for chest pain. Negative for palpitations and leg swelling.  Gastrointestinal: Negative for  abdominal pain, blood in stool and heartburn.  Genitourinary: Negative for dysuria, frequency and urgency.  Musculoskeletal: Positive for joint pain (L shoulder). Negative for myalgias.  Skin: Negative for rash.  Neurological: Negative for tremors, weakness and headaches.  Psychiatric/Behavioral: Positive for depression. The patient is nervous/anxious.       Objective  Vitals:   06/10/16 1340  BP: 106/74  Pulse: 83  Resp: 16  Temp: 97.8 F (36.6 C)  TempSrc: Oral  Weight: 119 lb (54 kg)  Height: 5\' 7"  (1.702 m)    Physical Exam  Constitutional: He is oriented to person, place, and time and well-developed, well-nourished, and in no distress. No distress.  HENT:  Head: Normocephalic and atraumatic.  Mouth/Throat:    Eyes: Conjunctivae and EOM are normal. Pupils are equal, round, and reactive to light. No scleral icterus.  Neck: Normal range of motion. Neck supple. Carotid bruit is not present. No thyromegaly present.  Cardiovascular: Normal rate, regular rhythm and normal heart sounds.  Exam reveals no gallop and no friction rub.   No murmur heard. Pulmonary/Chest: Effort normal and breath sounds normal. No respiratory distress. He has no wheezes. He has no rales.  Abdominal: Soft. Bowel sounds are normal. He exhibits no distension and no mass. There is no tenderness.  Musculoskeletal: He exhibits no edema.  Lymphadenopathy:    He has no cervical adenopathy.  Neurological: He is alert and oriented to person, place, and time.  Vitals reviewed.      No results found for this or any previous visit (from the past 2160 hour(s)).   Assessment & Plan  Problem List Items Addressed This Visit    None    Visit Diagnoses    Chest discomfort    -  Primary   Relevant Medications   nitroGLYCERIN (NITROSTAT) 0.4 MG SL tablet   aspirin EC 81 MG tablet   Other Relevant Orders   EKG 12-Lead   Ambulatory referral to Cardiology      Meds ordered this encounter    Medications  . nitroGLYCERIN (NITROSTAT) 0.4 MG SL tablet    Sig: Place 1 tablet (0.4 mg total) under the tongue every 5 (five) minutes as needed for chest pain.    Dispense:  50 tablet    Refill:  3  . aspirin EC 81 MG tablet    Sig: Take 1 tablet (81 mg total) by mouth daily.    Dispense:  100 tablet  Refill:  12   1. Chest discomfort  - EKG 12-Lead - nitroGLYCERIN (NITROSTAT) 0.4 MG SL tablet; Place 1 tablet (0.4 mg total) under the tongue every 5 (five) minutes as needed for chest pain.  Dispense: 50 tablet; Refill: 3 - aspirin EC 81 MG tablet; Take 1 tablet (81 mg total) by mouth daily.  Dispense: 100 tablet; Refill: 12 - Ambulatory referral to Cardiology

## 2016-06-22 NOTE — Progress Notes (Signed)
Cardiology Office Note   Date:  07/01/2016   ID:  Dalton HolidayJames Marion Manheim, DOB 08/11/1951, MRN [id number]  Referring Doctor:  Fidel LevyJames Hawkins Jr, MD   Cardiologist:   Almond LintAileen Mehlani Blankenburg, MD   Reason for consultation:  Chief Complaint  Patient presents with  . Other    Chest discomfort. Meds reviewed verbally with pt.      History of Present Illness: Dalton Fleming is a 65 y.o. male who presents for CP. Center of chest, sharp pain, mild to moderate intensity, worse with lying down mostly, sometimes with exertion. Non radiating, on and of, few minutes at a time, ongoing for a year or so.   Some SOB, no palpitations, no syncope, abd pain.   ROS:  Please see the history of present illness. Aside from mentioned under HPI, all other systems are reviewed and negative.     Past Medical History:  Diagnosis Date  . BPH (benign prostatic hyperplasia)   . Depression   . Hx of hepatitis C   . Impotence   . Inability to urinate   . Peyronie's disease   . Urinary frequency     Past Surgical History:  Procedure Laterality Date  . APPENDECTOMY    . CERVICAL SPINE SURGERY    . HERNIA REPAIR       reports that he quit smoking 4 days ago. He quit after 20.00 years of use. He has never used smokeless tobacco. He reports that he drinks alcohol. He reports that he does not use drugs.   family history includes Breast cancer in his sister; Colon cancer in his paternal grandfather; Diabetes in his maternal grandmother; Heart Problems in his maternal grandfather; Heart disease in his father; Leukemia in his mother.   Outpatient Medications Prior to Visit  Medication Sig Dispense Refill  . ALPRAZolam (XANAX) 1 MG tablet Take 1 tablet every 6 hours as needed for anxiety and sleep. Max 3 tabs a day    . aspirin EC 81 MG tablet Take 1 tablet (81 mg total) by mouth daily. 100 tablet 12  . finasteride (PROSCAR) 5 MG tablet Take 1 tablet (5 mg total) by mouth daily. 30 tablet 0  . HYDROmorphone  (DILAUDID) 4 MG tablet 1 tab Q 6 hrs, 4 per day    . lamoTRIgine (LAMICTAL) 100 MG tablet 1 po Q AM and Q PM    . meloxicam (MOBIC) 15 MG tablet Take 1 tablet (15 mg total) by mouth daily. 15 tablet 0  . nitroGLYCERIN (NITROSTAT) 0.4 MG SL tablet Place 1 tablet (0.4 mg total) under the tongue every 5 (five) minutes as needed for chest pain. 50 tablet 3  . sertraline (ZOLOFT) 100 MG tablet Take by mouth.    . tamsulosin (FLOMAX) 0.4 MG CAPS capsule Take 1 capsule (0.4 mg total) by mouth daily. 30 capsule 3  . mirtazapine (REMERON) 15 MG tablet 1/2 tab BID and 1 tab HS, 2 per day     No facility-administered medications prior to visit.      Allergies: Codeine    PHYSICAL EXAM: VS:  BP 100/80 (BP Location: Right Arm, Patient Position: Sitting, Cuff Size: Normal)   Pulse 83   Ht 5\' 7"  (1.702 m)   Wt 118 lb 8 oz (53.8 kg)   BMI 18.56 kg/m  , Body mass index is 18.56 kg/m. Wt Readings from Last 3 Encounters:  06/30/16 118 lb 8 oz (53.8 kg)  06/10/16 119 lb (54 kg)  06/03/15 137 lb  9.6 oz (62.4 kg)    GENERAL:  well developed, well nourished, not in acute distress HEENT: normocephalic, pink conjunctivae, anicteric sclerae, no xanthelasma, normal dentition, oropharynx clear NECK:  no neck vein engorgement, JVP normal, no hepatojugular reflux, carotid upstroke brisk and symmetric, no bruit, no thyromegaly, no lymphadenopathy LUNGS:  good respiratory effort, clear to auscultation bilaterally CV:  PMI not displaced, no thrills, no lifts, S1 and S2 within normal limits, no palpable S3 or S4, no murmurs, no rubs, no gallops ABD:  Soft, nontender, nondistended, normoactive bowel sounds, no abdominal aortic bruit, no hepatomegaly, no splenomegaly MS: nontender back, no kyphosis, no scoliosis, no joint deformities EXT:  2+ DP/PT pulses, no edema, no varicosities, no cyanosis, no clubbing SKIN: warm, nondiaphoretic, normal turgor, no ulcers, note of soft mass on back, near L scapula (pt aware of  this, has told PCP) NEUROPSYCH: alert, oriented to person, place, and time, sensory/motor grossly intact, normal mood, appropriate affect  Recent Labs: 06/23/2016: BUN 18; Creatinine, Ser 1.02; Hemoglobin 13.2; Platelets 283; Potassium 3.4; Sodium 138   Lipid Panel    Component Value Date/Time   CHOL 207 (H) 02/17/2012 1437   TRIG 101 02/17/2012 1437   HDL 50 02/17/2012 1437   VLDL 20 02/17/2012 1437   LDLCALC 137 (H) 02/17/2012 1437     Other studies Reviewed:  EKG:  The ekg from 06/30/2016 was personally reviewed by me and it revealed sinus rhythm, 83 BPM.  Additional studies/ records that were reviewed personally reviewed by me today include: None available   ASSESSMENT AND PLAN: Atypical CP Pt has risk factors for cad Unable to walk on treadmill rec pharmacologic nuc stress tst for rule out. Rec echo. If negative for cardiac etiology, defer work up to PCP for possible non cardiac causes. Consider CT chest.     Current medicines are reviewed at length with the patient today.  The patient does not have concerns regarding medicines.  Labs/ tests ordered today include:  Orders Placed This Encounter  Procedures  . NM Myocar Multi W/Spect W/Wall Motion / EF  . EKG 12-Lead  . ECHOCARDIOGRAM COMPLETE    I had a lengthy and detailed discussion with the patient regarding diagnoses, prognosis, diagnostic options, treatment options , and side effects of medications.   I counseled the patient on importance of lifestyle modification including heart healthy diet, regular physical activity   Disposition:   FU with undersigned after tests prn  I spent at least 60 minutes with the patient today and more than 50% of the time was spent counseling the patient and coordinating care.    Signed, Almond Lint, MD  07/01/2016 2:30 PM    [facility]  This note was generated in part with voice recognition software and I apologize for any typographical errors  that were not detected and corrected.

## 2016-06-23 ENCOUNTER — Emergency Department: Payer: Medicare Other

## 2016-06-23 ENCOUNTER — Emergency Department
Admission: EM | Admit: 2016-06-23 | Discharge: 2016-06-23 | Disposition: A | Discharge status: Home / Self Care | Payer: Medicare Other | Attending: Emergency Medicine | Admitting: Emergency Medicine

## 2016-06-23 ENCOUNTER — Encounter: Payer: Self-pay | Admitting: Emergency Medicine

## 2016-06-23 DIAGNOSIS — F17200 Nicotine dependence, unspecified, uncomplicated: Secondary | ICD-10-CM | POA: Insufficient documentation

## 2016-06-23 DIAGNOSIS — R079 Chest pain, unspecified: Secondary | ICD-10-CM | POA: Insufficient documentation

## 2016-06-23 DIAGNOSIS — Z5321 Procedure and treatment not carried out due to patient leaving prior to being seen by health care provider: Secondary | ICD-10-CM | POA: Insufficient documentation

## 2016-06-23 DIAGNOSIS — Z7982 Long term (current) use of aspirin: Secondary | ICD-10-CM | POA: Insufficient documentation

## 2016-06-23 DIAGNOSIS — Z79899 Other long term (current) drug therapy: Secondary | ICD-10-CM | POA: Insufficient documentation

## 2016-06-23 LAB — BASIC METABOLIC PANEL
ANION GAP: 8 (ref 5–15)
BUN: 18 mg/dL (ref 6–20)
CALCIUM: 9.2 mg/dL (ref 8.9–10.3)
CO2: 24 mmol/L (ref 22–32)
CREATININE: 1.02 mg/dL (ref 0.61–1.24)
Chloride: 106 mmol/L (ref 101–111)
GFR calc Af Amer: >60 mL/min (ref >60)
GFR calc non Af Amer: >60 mL/min (ref >60)
GLUCOSE: 98 mg/dL (ref 65–99)
POTASSIUM: 3.4 mmol/L — AB (ref 3.5–5.1)
SODIUM: 138 mmol/L (ref 135–145)

## 2016-06-23 LAB — CBC
HEMATOCRIT: 38.6 % — AB (ref 40.0–52.0)
Hemoglobin: 13.2 g/dL (ref 13.0–18.0)
MCH: 25.1 pg — AB (ref 26.0–34.0)
MCHC: 34.3 g/dL (ref 32.0–36.0)
MCV: 73.3 fL — AB (ref 80.0–100.0)
PLATELETS: 283 10*3/uL (ref 150–440)
RBC: 5.27 MIL/uL (ref 4.40–5.90)
RDW: 17.6 % — ABNORMAL HIGH (ref 11.5–14.5)
WBC: 7.9 10*3/uL (ref 3.8–10.6)

## 2016-06-23 LAB — TROPONIN I: Troponin I: <0.03 ng/mL (ref <0.03)

## 2016-06-23 NOTE — ED Triage Notes (Signed)
Pt presents with chest pain started ten years ago off and on but worsening today.

## 2016-06-30 ENCOUNTER — Ambulatory Visit (INDEPENDENT_AMBULATORY_CARE_PROVIDER_SITE_OTHER): Payer: Medicare Other | Admitting: Cardiology

## 2016-06-30 ENCOUNTER — Encounter: Payer: Self-pay | Admitting: Cardiology

## 2016-06-30 VITALS — BP 100/80 | HR 83 | Ht 67.0 in | Wt 118.5 lb

## 2016-06-30 DIAGNOSIS — R0789 Other chest pain: Secondary | ICD-10-CM

## 2016-06-30 NOTE — Patient Instructions (Addendum)
Testing/Procedures: Your physician has requested that you have an echocardiogram. Echocardiography is a painless test that uses sound waves to create images of your heart. It provides your doctor with information about the size and shape of your heart and how well your heart's chambers and valves are working. This procedure takes approximately one hour. There are no restrictions for this procedure.  [facility] MYOVIEW  Your caregiver has ordered a Stress Test with nuclear imaging. The purpose of this test is to evaluate the blood supply to your heart muscle. This procedure is referred to as a "Non-Invasive Stress Test." This is because other than having an IV started in your vein, nothing is inserted or "invades" your body. Cardiac stress tests are done to find areas of poor blood flow to the heart by determining the extent of coronary artery disease (CAD). Some patients exercise on a treadmill, which naturally increases the blood flow to your heart, while others who are  unable to walk on a treadmill due to physical limitations have a pharmacologic/chemical stress agent called Lexiscan . This medicine will mimic walking on a treadmill by temporarily increasing your coronary blood flow.   Please note: these test may take anywhere between 2-4 hours to complete  PLEASE REPORT TO [facility]ICAL M[facility] ENTRANCE  THE VOLUNTEERS AT THE FIRST DESK WILL DIRECT YOU WHERE TO GO  Date of Procedure:_Wednesday July 14, 2016 at 08:00AM___  Arrival Time for Procedure:___Arrive at 07:45AM to register______    PLEASE NOTIFY THE OFFICE AT LEAST 24 HOURS IN ADVANCE IF YOU ARE UNABLE TO KEEP YOUR APPOINTMENT.  [phone] AND  PLEASE NOTIFY NUCLEAR MEDICINE AT [facility] AT LEAST 24 HOURS IN ADVANCE IF YOU ARE UNABLE TO KEEP YOUR APPOINTMENT. [phone]  How to prepare for your Myoview test:  1. Do not eat or drink after midnight 2. No caffeine for 24 hours prior to test 3. No smoking 24 hours prior to test. 4. Your  medication may be taken with water.  If your doctor stopped a medication because of this test, do not take that medication. 5. Ladies, please do not wear dresses.  Skirts or pants are appropriate. Please wear a short sleeve shirt. 6. No perfume, cologne or lotion. 7. Wear comfortable walking shoes. No heels!    Follow-Up: Your physician recommends that you schedule a follow-up appointment as needed. We will call you with results and if needed schedule follow up at that time.  It was a pleasure seeing you today here in the office. Please do not hesitate to give [place] a call back if you have any further questions. [phone]  [place] Cellar RN, BSN     Echocardiogram An echocardiogram, or echocardiography, uses sound waves (ultrasound) to produce an image of your heart. The echocardiogram is simple, painless, obtained within a short period of time, and offers valuable information to your health care provider. The images from an echocardiogram can provide information such as:  Evidence of coronary artery disease (CAD).  Heart size.  Heart muscle function.  Heart valve function.  Aneurysm detection.  Evidence of a past heart attack.  Fluid buildup around the heart.  Heart muscle thickening.  Assess heart valve function. LET [facility] CARE PROVIDER KNOW ABOUT:  Any allergies you have.  All medicines you are taking, including vitamins, herbs, eye drops, creams, and over-the-counter medicines.  Previous problems you or members of your family have had with the use of anesthetics.  Any blood disorders you have.  Previous surgeries you have had.  Medical conditions you have.  Possibility of pregnancy, if this applies. BEFORE THE PROCEDURE  No special preparation is needed. Eat and drink normally.  PROCEDURE   In order to produce an image of your heart, gel will be applied to your chest and a wand-like tool (transducer) will be moved over your chest. The gel will help  transmit the sound waves from the transducer. The sound waves will harmlessly bounce off your heart to allow the heart images to be captured in real-time motion. These images will then be recorded.  You may need an IV to receive a medicine that improves the quality of the pictures. AFTER THE PROCEDURE You may return to your normal schedule including diet, activities, and medicines, unless your health care provider tells you otherwise.   This information is not intended to replace advice given to you by your health care provider. Make sure you discuss any questions you have with your health care provider.   Document Released: 10/08/2000 Document Revised: 11/01/2014 Document Reviewed: 06/18/2013 Elsevier Interactive Patient Education 2016 [employer] Inc[employer]    Pharmacologic Stress Electrocardiogram A pharmacologic stress electrocardiogram is a heart (cardiac) test that uses nuclear imaging to evaluate the blood supply to your heart. This test may also be called a pharmacologic stress electrocardiography. Pharmacologic means that a medicine is used to increase your heart rate and blood pressure.  This stress test is done to find areas of poor blood flow to the heart by determining the extent of coronary artery disease (CAD). Some people exercise on a treadmill, which naturally increases the blood flow to the heart. For those people unable to exercise on a treadmill, a medicine is used. This medicine stimulates your heart and will cause your heart to beat harder and more quickly, as if you were exercising.  Pharmacologic stress tests can help determine:  The adequacy of blood flow to your heart during increased levels of activity in order to clear you for discharge home.  The extent of coronary artery blockage caused by CAD.  Your prognosis if you have suffered a heart attack.  The effectiveness of cardiac procedures done, such as an angioplasty, which can increase the circulation in your coronary  arteries.  Causes of chest pain or pressure. LET [facility] KNOW ABOUT:  Any allergies you have.  All medicines you are taking, including vitamins, herbs, eye drops, creams, and over-the-counter medicines.  Previous problems you or members of your family have had with the use of anesthetics.  Any blood disorders you have.  Previous surgeries you have had.  Medical conditions you have.  Possibility of pregnancy, if this applies.  If you are currently breastfeeding. RISKS AND COMPLICATIONS Generally, this is a safe procedure. However, as with any procedure, complications can occur. Possible complications include:  You develop pain or pressure in the following areas:  Chest.  Jaw or neck.  Between your shoulder blades.  Radiating down your left arm.  Headache.  Dizziness or light-headedness.  Shortness of breath.  Increased or irregular heartbeat.  Low blood pressure.  Nausea or vomiting.  Flushing.  Redness going up the arm and slight pain during injection of medicine.  Heart attack (rare). BEFORE THE PROCEDURE   Avoid all forms of caffeine for 24 hours before your test or as directed by your health care provider. This includes coffee, tea (even decaffeinated tea), caffeinated sodas, chocolate, cocoa, and certain pain medicines.  Follow your health care provider's instructions regarding eating and drinking before the test.  Take your medicines as directed at regular times with water unless instructed otherwise. Exceptions may include:  If you have diabetes, ask how you are to take your insulin or pills. It is common to adjust insulin dosing the morning of the test.  If you are taking beta-blocker medicines, it is important to talk to your health care provider about these medicines well before the date of your test. Taking beta-blocker medicines may interfere with the test. In some cases, these medicines need to be changed or stopped 24 hours or  more before the test.  If you wear a nitroglycerin patch, it may need to be removed prior to the test. Ask your health care provider if the patch should be removed before the test.  If you use an inhaler for any breathing condition, bring it with you to the test.  If you are an outpatient, bring a snack so you can eat right after the stress phase of the test.  Do not smoke for 4 hours prior to the test or as directed by your health care provider.  Do not apply lotions, powders, creams, or oils on your chest prior to the test.  Wear comfortable shoes and clothing. Let your health care provider know if you were unable to complete or follow the preparations for your test. PROCEDURE   Multiple patches (electrodes) will be put on your chest. If needed, small areas of your chest may be shaved to get better contact with the electrodes. Once the electrodes are attached to your body, multiple wires will be attached to the electrodes, and your heart rate will be monitored.  An IV access will be started. A nuclear trace (isotope) is given. The isotope may be given intravenously, or it may be swallowed. Nuclear refers to several types of radioactive isotopes, and the nuclear isotope lights up the arteries so that the nuclear images are clear. The isotope is absorbed by your body. This results in low radiation exposure.  A resting nuclear image is taken to show how your heart functions at rest.  A medicine is given through the IV access.  A second scan is done about 1 hour after the medicine injection and determines how your heart functions under stress.  During this stress phase, you will be connected to an electrocardiogram machine. Your blood pressure and oxygen levels will be monitored. AFTER THE PROCEDURE   Your heart rate and blood pressure will be monitored after the test.  You may return to your normal schedule, including diet,activities, and medicines, unless your health care provider  tells you otherwise.   This information is not intended to replace advice given to you by your health care provider. Make sure you discuss any questions you have with your health care provider.   Document Released: 02/27/2009 Document Revised: 10/16/2013 Document Reviewed: 06/18/2013 Elsevier Interactive Patient Education [employer]

## 2016-07-13 ENCOUNTER — Ambulatory Visit: Payer: Medicare Other | Admitting: Family Medicine

## 2016-07-13 ENCOUNTER — Telehealth: Payer: Self-pay | Admitting: Cardiology

## 2016-07-13 NOTE — Telephone Encounter (Signed)
Spoke with patient and confirmed stress test scheduled for tomorrow, time, location, and instructions. He verbalized understanding and had no further questions at this time.

## 2016-07-14 ENCOUNTER — Encounter
Admission: RE | Admit: 2016-07-14 | Discharge: 2016-07-14 | Disposition: A | Discharge status: Home / Self Care | Payer: Medicare Other | Source: Ambulatory Visit | Attending: Cardiology | Admitting: Cardiology

## 2016-07-14 DIAGNOSIS — R0789 Other chest pain: Secondary | ICD-10-CM | POA: Insufficient documentation

## 2016-07-14 MED ORDER — TECHNETIUM TC 99M TETROFOSMIN IV KIT
32.1280 | PACK | Freq: Once | INTRAVENOUS | Status: AC | PRN
  Administered 2016-07-14: 32.128 via INTRAVENOUS

## 2016-07-14 MED ORDER — REGADENOSON 0.4 MG/5ML IV SOLN
0.4000 mg | Freq: Once | INTRAVENOUS | Status: AC
  Administered 2016-07-14: 0.4 mg via INTRAVENOUS

## 2016-07-14 MED ORDER — TECHNETIUM TC 99M TETROFOSMIN IV KIT
14.0700 | PACK | Freq: Once | INTRAVENOUS | Status: AC | PRN
  Administered 2016-07-14: 14.07 via INTRAVENOUS

## 2016-07-15 LAB — NM MYOCAR MULTI W/SPECT W/WALL MOTION / EF
CHL CUP NUCLEAR SDS: 1
CSEPPHR: 87 {beats}/min
LVDIAVOL: 71 mL (ref 62–150)
LVSYSVOL: 34 mL
NUC STRESS TID: 1.38
Percent HR: 55 %
Rest HR: 71 {beats}/min
SRS: 3
SSS: 3

## 2016-07-21 ENCOUNTER — Other Ambulatory Visit: Payer: Medicare Other

## 2016-07-21 ENCOUNTER — Encounter: Payer: Self-pay | Admitting: *Deleted

## 2016-07-27 ENCOUNTER — Telehealth: Payer: Self-pay | Admitting: Cardiology

## 2016-07-27 NOTE — Telephone Encounter (Signed)
Patient called in to review test results. Reviewed stress test results with patient and he verbalized understanding with no further questions at this time.

## 2016-07-27 NOTE — Telephone Encounter (Signed)
Patient wants test results.   Please call

## 2016-07-28 ENCOUNTER — Ambulatory Visit: Payer: Medicare Other | Admitting: Cardiology

## 2016-08-12 ENCOUNTER — Other Ambulatory Visit: Payer: Medicare Other

## 2016-09-10 ENCOUNTER — Telehealth: Payer: Self-pay | Admitting: Family Medicine

## 2016-09-10 NOTE — Telephone Encounter (Signed)
Pt needs [facility] ref[facility].  His call back number is [phone]

## 2016-09-10 NOTE — Telephone Encounter (Signed)
Patient advised urology prescribed this medication. He needs to request by prescriber.

## 2016-09-13 ENCOUNTER — Ambulatory Visit: Payer: Medicare Other

## 2016-10-06 DIAGNOSIS — Z79891 Long term (current) use of opiate analgesic: Secondary | ICD-10-CM | POA: Diagnosis not present

## 2016-10-26 ENCOUNTER — Encounter: Payer: Self-pay | Admitting: *Deleted

## 2016-10-26 ENCOUNTER — Other Ambulatory Visit: Payer: Medicare Other

## 2016-11-02 ENCOUNTER — Ambulatory Visit: Payer: Medicare Other | Admitting: Cardiology

## 2016-11-26 ENCOUNTER — Encounter: Payer: Self-pay | Admitting: *Deleted

## 2017-01-18 DIAGNOSIS — G5692 Unspecified mononeuropathy of left upper limb: Secondary | ICD-10-CM | POA: Diagnosis not present

## 2017-02-18 ENCOUNTER — Other Ambulatory Visit: Payer: Self-pay

## 2017-02-18 DIAGNOSIS — N400 Enlarged prostate without lower urinary tract symptoms: Secondary | ICD-10-CM

## 2017-02-18 NOTE — Telephone Encounter (Signed)
Pharmacy requesting refill 04/01/16 Last ov 06/10/16 Last filled  Please review. Thank you. sd

## 2017-04-04 ENCOUNTER — Other Ambulatory Visit: Payer: Self-pay

## 2017-04-04 DIAGNOSIS — R0789 Other chest pain: Secondary | ICD-10-CM

## 2017-04-04 MED ORDER — ASPIRIN EC 81 MG PO TBEC: 81.0000 mg | DELAYED_RELEASE_TABLET | Freq: Every day | ORAL | 12 refills | Status: AC

## 2017-07-01 ENCOUNTER — Ambulatory Visit (INDEPENDENT_AMBULATORY_CARE_PROVIDER_SITE_OTHER): Payer: Medicare Other | Admitting: Family Medicine

## 2017-07-01 ENCOUNTER — Encounter: Payer: Self-pay | Admitting: Family Medicine

## 2017-07-01 VITALS — BP 115/86 | HR 90 | Temp 98.2°F | Resp 16 | Ht 67.0 in | Wt 114.0 lb

## 2017-07-01 DIAGNOSIS — N401 Enlarged prostate with lower urinary tract symptoms: Secondary | ICD-10-CM

## 2017-07-01 DIAGNOSIS — L723 Sebaceous cyst: Secondary | ICD-10-CM

## 2017-07-01 DIAGNOSIS — Z23 Encounter for immunization: Secondary | ICD-10-CM | POA: Diagnosis not present

## 2017-07-01 DIAGNOSIS — R338 Other retention of urine: Secondary | ICD-10-CM

## 2017-07-01 DIAGNOSIS — R0789 Other chest pain: Secondary | ICD-10-CM

## 2017-07-01 DIAGNOSIS — M940 Chondrocostal junction syndrome [Tietze]: Secondary | ICD-10-CM | POA: Diagnosis not present

## 2017-07-01 DIAGNOSIS — N486 Induration penis plastica: Secondary | ICD-10-CM | POA: Diagnosis not present

## 2017-07-01 DIAGNOSIS — G8929 Other chronic pain: Secondary | ICD-10-CM | POA: Diagnosis not present

## 2017-07-01 HISTORY — DX: Other chronic pain: G89.29

## 2017-07-01 MED ORDER — MELOXICAM 15 MG PO TABS: 15.0000 mg | ORAL_TABLET | Freq: Every day | ORAL | 2 refills | Status: DC | PRN

## 2017-07-01 NOTE — Progress Notes (Signed)
Subjective:    Patient ID: Dalton Fleming, male    DOB: 04/27/1951, 66 y.o.   MRN: [id number]  Dalton Fleming is a 66 y.o. male presenting on 07/01/2017 for Cholelithiasis (as per pt has some chest discomfort and wife has same Sx who was diagnosed with gallstone )  Previously established with prior PCP [facility] Fleming [facility]. Last office visit 05/2016. Here to re-establish care.  HPI   Chronic Atypical Chest Wall Pain - Last visit with PCP 05/2016 here for same complaint of chronic intermittent chest wall pain, same location lower left sternal border, atypical features, present for almost 4-5 years, he was referred to Cardiology for further testing, saw Dalton Fleming 06/2016, arranged nuclear stress test since unable to walk on treadmill, test was negative with Low Risk Scan, see results below, thought more non cardiac etiology, patient was lost to follow-up for almost 1 year. - Now today he returns for same concern, he is asking about potential Gallstones causing his chest discomfort for 5 years (recently his wife was dx with gallstones and he is concerned that he may have the same). However does not endorse any RUQ abdominal pain, pain is not triggered or worse with eating seems to have no relation, no associated burning or heartburn - Admits intermittent chest discomfort episodes, worse laying down, it does seem to be reproducible to touch in that same area - It is improved with his chronic pain medicine, Hydromorphone PRN, in past was improved on NSAID no longer taking this, not taking Tylenol and not tried muscle rub - He does endorse history of old rib fractures, unsure exact location, identified on last CXR 05/2016 - Denies any exertional chest pain or symptoms, chest tightness or pressure, heartburn,  Nausea vomiting, diarrhea, cough, dyspnea, near syncope or syncope, blood in stool, dark stools  Other PMH - History of complex OA/DJD with Chronic Pain, with known C-spine DJD  and s/p surgery 5th disc removed, s/p hardware, reports had failed surgery, and has chronic L shoulder pain, followed by Pain Management, on Hydromorphone, has completed PT - Admits poor sleep and nocturia  SEBACEOUS CYST, Back - Reports he has a chronic cyst on his back L side mid back, seems to cause some discomfort intermittently, thinks it is "pressing on a nerve" also it can go through flares of swelling up and draining material, now currently not infected. - He was referred to see a general surgeon about 1 year ago to remove it, however they only "looked at it and then said he needed another apt to remove it" therefore he did not schedule for surgery due to concern he already paid $60 for initial consult. He would like to be referred to a new surgeon today to have the cyst removed the same day.  Peyronie's Disease / ED - Previously followed by Dalton Edwyna ShellHart at [facility], last visit 06/03/15, however states that this provide retired and he was given a new provider that was male, states he has a preference to only see a Male Urologist, and did not return, he would like us to arrange a return referral to [facility] to see a male provider  Health Maintenance: - Due for Flu shot today, will receive - Due for Prevnar-13, vaccine today will receive, next dose due pneumovax-23 in 1 year  Social History  Substance Use Topics  . Smoking status: Former Smoker    Years: 20.00    Quit date: 06/27/2016  . Smokeless tobacco:  Never Used  . Alcohol use 0.0 oz/week     Comment: occassional    Review of Systems Per HPI unless specifically indicated above     Objective:    BP 115/86   Pulse 90   Temp 98.2 F (36.8 C) (Oral)   Resp 16   Ht  (1.702 m)   Wt 114 lb (51.7 kg)   BMI 17.85 kg/m   Wt Readings from Last 3 Encounters:  07/01/17 114 lb (51.7 kg)  06/30/16 118 lb 8 oz (53.8 kg)  06/10/16 119 lb (54 kg)    Physical Exam  Constitutional: He is oriented to person, place, and time. He appears  well-developed and well-nourished. No distress.  Chronically ill and thin appearing 66 yr old male, comfortable, cooperative  HENT:  Head: Normocephalic and atraumatic.  Mouth/Throat: Oropharynx is clear and moist.  Eyes: Conjunctivae are normal. Right eye exhibits no discharge. Left eye exhibits no discharge.  Neck: Normal range of motion. Neck supple. No thyromegaly present.  No carotid bruits  Cardiovascular: Normal rate, regular rhythm, normal heart sounds and intact distal pulses.   No murmur heard. Pulmonary/Chest: Effort normal and breath sounds normal. No respiratory distress. He has no wheezes. He has no rales. He exhibits tenderness (Left lower sternal border with notable bony prominence and reproducible tenderness).  Abdominal: Soft. Bowel sounds are normal. He exhibits no distension and no mass. There is no tenderness. There is no rebound and no guarding.  Non tender RUQ. Negative Murphy's on deep inspiration.  Musculoskeletal: Normal range of motion. He exhibits no edema.  Lymphadenopathy:    He has no cervical adenopathy.  Neurological: He is alert and oriented to person, place, and time.  Distal sensation intact light touch  Skin: Skin is warm and dry. No rash noted. He is not diaphoretic. No erythema.  Left sided mid thoracic back, approx T7 with 2 x 2 cm soft mobile fluctuance sebaceous cyst, no erythema, non tender, central pore  Psychiatric: He has a normal mood and affect. His behavior is normal.  Well groomed, good eye contact, normal speech and thoughts  Nursing note and vitals reviewed.  I have personally reviewed the Cardiac Nuclear Stress test report from 07/14/16.  Pharmacological myocardial perfusion imaging study with no significant  ischemia Normal wall motion, EF estimated at 56% No EKG changes concerning for ischemia at peak stress or in recovery. Low risk scan  Signed, Dossie Arbour, MD, Ph.D [facility]   I have personally reviewed the radiology  report from 06/23/16 CXR.  CLINICAL DATA:  Worsening chest pain. Increasingly frequent over the last 10 years.  EXAM:  HISTORY OF SMOKING.: EXAM:  HISTORY OF SMOKING. CHEST  2 VIEW  COMPARISON:  None.  FINDINGS: The heart size and mediastinal contours are within normal limits. Both lungs are clear, but hyperinflated. No effusion or pneumothorax. No acute osseous findings. Old rib fractures. Previous cervical fusion.  IMPRESSION: COPD, no active disease.   Electronically Signed   By: Elsie Stain M.D.   On: 06/23/2016 13:43  ---------------- I have personally reviewed the radiology report from 02/22/14 [place]  * PRIOR REPORT IMPORTED FROM AN EXTERNAL SYSTEM *   CLINICAL DATA:  Hepatitis-C   EXAM:  US ABDOMEN LIMITED - RIGHT UPPER QUADRANT   ULTRAOUND HEPATIC ELASTOGRAPHY   TECHNIQUE:  Limited right upper quadrant abdominal ultrasound was performed. In  addition, ultrasound elastography evaluation of the liver was  performed. A region of interest was placed in the  right lobe of the  liver. Following application of a compressive sonographic pulse,  shear waves were detected in the adjacent hepatic tissue and the  shear wave velocity was calculated. Multiple assessments were  performed at the selected site. Median shear wave velocity is  correlated to a Metavir fibrosis score.   COMPARISON:  None   CORRELATIVE IMAGING:  CT abdomen pelvis 11/30/2010   FINDINGS:  ULTRASOUND ABDOMEN LIMITED RIGHT UPPER QUADRANT   Gallbladder:   Normally distended without stones or wall thickening.   No pericholecystic fluid or sonographic Murphy sign.   Common bile duct:   Diameter: Normal caliber 4 mm diameter   Liver:   Increased hepatic echogenicity question fatty infiltration or  cirrhosis, though this can be seen with certain infiltrative  disorders. No focal hepatic mass or nodularity. Hepatopetal portal  venous flow.   ULTRASOUND HEPATIC  ELASTOGRAPHY   Hepatic Segment:  5   Number of measurements:  11   Median velocity:   1.53  m/sec   Corresponding Metavir fibrosis score:  F1/F2   Pertinent liver disease findings noted on other imaging exams: None  noted or none available   Please note that abnormal shear wave velocities may also be  identified in clinical settings other than with hepatic fibrosis,  such as: Acute hepatitis, elevated right heart and central venous  pressures, veno-occlusive disease (Budd-Chiari), infiltrative  processes such as mastocytosis/amyloidosis/infiltrative tumor,  extrahepatic cholestasis, in the post-prandial state, and liver  transplantation. Correlation with patient history, laboratory data,  and clinical condition recommended.   IMPRESSION:  ULTRASOUND ABDOMEN:  Echogenic liver question fatty infiltration versus cirrhosis.   No focal hepatic mass or nodularity.   Unremarkable gallbladder and CBD   ULTRASOUND HEPATIC ELASTOGRAHY:  Median hepatic shear wave velocity is calculated at 1.53 m/sec,  which corresponds to a Metavir fibrosis score of F1/F2.    Electronically Signed    By: Ulyses Southward M.D.    On: 02/22/2014 09:39     Results for orders placed or performed during the hospital encounter of 07/14/16  NM Myocar Multi W/Spect W/Wall Motion / EF  Result Value Ref Range   Rest HR 71 bpm   Rest BP 111/62 mmHg   Percent HR 55 %   Peak HR 87 bpm   Peak BP 116/69 mmHg   SSS 3    SRS 3    SDS 1    TID 1.38    LV sys vol 34 mL   LV dias vol 71 62 - 150 mL      Assessment & Plan:   Problem List Items Addressed This Visit    Sebaceous cyst    Moderate sized Left mid thoracic uncomplicated sebaceous cyst No secondary infection Referral to [facility], requesting to have cyst removal done on same day if possible, will send notice and attempt to call [facility] as well to clarify this      Relevant Orders   Ambulatory referral to General Surgery    Peyronie disease   Relevant Orders   Ambulatory referral to Urology   Chronic chest wall pain - Primary    Suspected to be more MSK etiology with chronic costochondritis with history of prior rib fractures vs other etiology, reproducible, atypical features, negative nuclear stress and not seem cardiac in nature, no longer following with Cardiology. Also no significant GI symptoms, not indicative of cholelithiasis or cholecystitis, no heartburn or PUD. - Imaging on chart: CXR 05/2016, shows COPD and old rib  fractures. RUQ [place] in 2015 showed normal GB no cholelithiasis  Plan: 1. Start with NSAIDs, refilled Meloxicam  daily 1-2 weeks then PRN, had been off NSAID for long time 2. May use Tylenol PRN breakthrough 3. Trial topical muscle rub as well, heating pad 4. Future follow-up consider baclofen, gabapentin, or other alternative pain management, also consider offer RUQ abdomen US to rule out gallbladder if patient still concerned about this, and more likely may need chest wall CT as recommended by Cardiology if ruled out cardiac etiology      Relevant Medications   meloxicam (MOBIC) 15 MG tablet   BPH (benign prostatic hyperplasia)   Relevant Orders   Ambulatory referral to Urology    Other Visit Diagnoses    Costochondritis       Relevant Medications   meloxicam (MOBIC) 15 MG tablet   Needs flu shot       Relevant Orders   Flu Vaccine QUAD 6+ mos PF IM (Fluarix Quad PF) (Completed)   Need for pneumococcal vaccine       Relevant Orders   Pneumococcal conjugate vaccine 13-valent (Completed)      Meds ordered this encounter  Medications  . meloxicam (MOBIC) 15 MG tablet    Sig: Take 1 tablet (15 mg total) by mouth daily as needed for pain.    Dispense:  30 tablet    Refill:  2    Follow up plan: Return in about 3 months (around 09/30/2017) for Costochronditis / follow-up.  Saralyn Pilar, DO [facility] 07/02/2017, 9:19 AM

## 2017-07-01 NOTE — Patient Instructions (Addendum)
Thank you for coming to the clinic today.  1.  Most likely a costochondritis or inflammation of chest wall and ribcage  Recommend taking anti-inflammatory Meloxicam 15mg  daily with food once daily every day for next 2 weeks then ONLY as needed.  Recommend to start taking Tylenol Extra Strength 500mg  tabs - take 1 to 2 tabs per dose (max 1000mg ) every 6-8 hours for pain (take regularly, don't skip a dose for next 7 days), max 24 hour daily dose is 6 tablets or 3000mg . In the future you can repeat the same everyday Tylenol course for 1-2 weeks at a time.   Try heating pad or topical muscle rub icy hot, see if any relief.  Referral to Urology  [facility] -1st floor [address] [place]  [id number] Phone: [phone]  --------------------------------------  Referral to General Surgery for Sebaceous Cyst Removal - will notify them of your request to have procedure done same day, however I cannot guarantee this  [facility] [place] Location [address] [place], [place] [address] Hours: 8:30am to 5pm (M-F) Ph [phone]  [facility] [address] [place], [place]  [address] Hours: 8:30am to 5pm (M-F) Ph [phone]  Please schedule a Follow-up Appointment to: Return in about 3 months (around 09/30/2017) for Costochronditis / follow-up.  If you have any other questions or concerns, please feel free to call the clinic or send a message through MyChart. You may also schedule an earlier appointment if necessary.  Additionally, you may be receiving a survey about your experience at our clinic within a few days to 1 week by e-mail or mail. We value your feedback.  Saralyn PilarAlexander Adiana Smelcer, DO [facility]

## 2017-07-02 NOTE — Assessment & Plan Note (Signed)
Moderate sized Left mid thoracic uncomplicated sebaceous cyst No secondary infection Referral to [facility], requesting to have cyst removal done on same day if possible, will send notice and attempt to call [facility] as well to clarify this

## 2017-07-02 NOTE — Assessment & Plan Note (Addendum)
Suspected to be more MSK etiology with chronic costochondritis with history of prior rib fractures vs other etiology, reproducible, atypical features, negative nuclear stress and not seem cardiac in nature, no longer following with Cardiology. Also no significant GI symptoms, not indicative of cholelithiasis or cholecystitis, no heartburn or PUD. - Imaging on chart: CXR 05/2016, shows COPD and old rib fractures. RUQ US in 2015 showed normal GB no cholelithiasis  Plan: 1. Start with NSAIDs, refilled Meloxicam 15mg  daily 1-2 weeks then PRN, had been off NSAID for long time 2. May use Tylenol PRN breakthrough 3. Trial topical muscle rub as well, heating pad 4. Future follow-up consider baclofen, gabapentin, or other alternative pain management, also consider offer RUQ abdomen US to rule out gallbladder if patient still concerned about this, and more likely may need chest wall CT as recommended by Cardiology if ruled out cardiac etiology

## 2017-07-05 ENCOUNTER — Ambulatory Visit: Payer: Medicare Other | Admitting: Urology

## 2017-07-14 ENCOUNTER — Ambulatory Visit: Payer: Medicare Other | Admitting: Surgery

## 2017-07-19 ENCOUNTER — Ambulatory Visit: Payer: Self-pay | Admitting: Surgery

## 2017-07-20 DIAGNOSIS — Z79891 Long term (current) use of opiate analgesic: Secondary | ICD-10-CM | POA: Diagnosis not present

## 2017-07-20 DIAGNOSIS — G5692 Unspecified mononeuropathy of left upper limb: Secondary | ICD-10-CM | POA: Diagnosis not present

## 2017-07-20 DIAGNOSIS — M5030 Other cervical disc degeneration, unspecified cervical region: Secondary | ICD-10-CM | POA: Diagnosis not present

## 2017-07-26 ENCOUNTER — Other Ambulatory Visit: Payer: Self-pay

## 2017-07-26 ENCOUNTER — Ambulatory Visit: Payer: Self-pay | Admitting: Surgery

## 2017-07-26 ENCOUNTER — Telehealth: Payer: Self-pay | Admitting: General Practice

## 2017-07-26 NOTE — Telephone Encounter (Signed)
Unable to reach patient, patient no showed appointment on 07/26/17 for a sebaceous cyst on back (mid L thoracic) referred by Dr. Althea Charon, if patient calls back, please r/s this appointment.

## 2017-07-27 ENCOUNTER — Ambulatory Visit: Payer: Medicare Other

## 2017-08-01 NOTE — Telephone Encounter (Signed)
Patient's coming in on 11/6.

## 2017-08-26 ENCOUNTER — Other Ambulatory Visit: Payer: Self-pay | Admitting: Family Medicine

## 2017-08-26 DIAGNOSIS — M940 Chondrocostal junction syndrome [Tietze]: Secondary | ICD-10-CM

## 2017-08-26 DIAGNOSIS — G8929 Other chronic pain: Secondary | ICD-10-CM

## 2017-08-26 DIAGNOSIS — R0789 Other chest pain: Principal | ICD-10-CM

## 2017-08-30 ENCOUNTER — Ambulatory Visit: Payer: Self-pay | Admitting: Surgery

## 2017-08-30 ENCOUNTER — Other Ambulatory Visit: Payer: Self-pay | Admitting: Surgery

## 2017-08-30 ENCOUNTER — Ambulatory Visit (INDEPENDENT_AMBULATORY_CARE_PROVIDER_SITE_OTHER): Payer: Medicare Other | Admitting: Surgery

## 2017-08-30 ENCOUNTER — Encounter: Payer: Self-pay | Admitting: Surgery

## 2017-08-30 DIAGNOSIS — L720 Epidermal cyst: Secondary | ICD-10-CM | POA: Diagnosis not present

## 2017-08-30 DIAGNOSIS — L723 Sebaceous cyst: Secondary | ICD-10-CM

## 2017-08-30 DIAGNOSIS — L728 Other follicular cysts of the skin and subcutaneous tissue: Secondary | ICD-10-CM | POA: Diagnosis not present

## 2017-08-30 NOTE — Patient Instructions (Signed)
Epidermal Cyst Removal, Care After Refer to this sheet in the next few weeks. These instructions provide you with information about caring for yourself after your procedure. Your health care provider may also give you more specific instructions. Your treatment has been planned according to current medical practices, but problems sometimes occur. Call your health care provider if you have any problems or questions after your procedure. What can I expect after the procedure? After the procedure, it is common to have:  Soreness in the area where your cyst was removed.  Tightness or itching from your skin sutures.  Follow these instructions at home:  Take medicines only as directed by your health care provider.  If you were prescribed an antibiotic medicine, finish all of it even if you start to feel better.  Use antibiotic ointment as directed by your health care provider. Follow the instructions carefully.  There are many different ways to close and cover an incision, including stitches (sutures), skin glue, and adhesive strips. Follow your health care provider's instructions about: ? Incision care. ? Bandage (dressing) changes and removal. ? Incision closure removal.  Keep the bandage (dressing) dry until your health care provider says that it can be removed. Take sponge baths only. Ask your health care provider when you can start showering or taking a bath.  After your dressing is off, check your incision every day for signs of infection. Watch for: ? Redness, swelling, or pain. ? Fluid, blood, or pus.  You can return to your normal activities. Do not do anything that stretches or puts pressure on your incision.  You can return to your normal diet.  Keep all follow-up visits as directed by your health care provider. This is important. Contact a health care provider if:  You have a fever.  Your incision bleeds.  You have redness, swelling, or pain in the incision area.  You  have fluid, blood, or pus coming from your incision.  Your cyst comes back after surgery. This information is not intended to replace advice given to you by your health care provider. Make sure you discuss any questions you have with your health care provider. Document Released: 11/01/2014 Document Revised: 03/18/2016 Document Reviewed: 06/26/2014 Elsevier Interactive Patient Education  2018 Elsevier Inc.  

## 2017-08-30 NOTE — Addendum Note (Signed)
Addended by: Adela PortsBONICHE, Genola Yuille on: 08/30/2017 03:00 PM   Modules accepted: Orders

## 2017-08-30 NOTE — Progress Notes (Signed)
Surgical Consultation  08/30/2017  Dalton Fleming is an 66 y.o. male.   Chief Complaint  Patient presents with  . New Patient (Initial Visit)    Sebaceous cyst on mid back     HPI: [name] is a 66 year old male seen in consultation at the request of Dr Althea CharonKaramalegos for symptomatic cyst on the subcutaneous tissue of his post thoracic area. He reports that he has had it for several years. She reports some intermittent sharp pain worsening when he lays on his back. Pain is moderate in intensity. No fevers no chills. He passed the cyst has been infected but currently denies any fevers or chills. He does have a history of chronic pain with multiple C-spine surgeries in the past. He apparently quit smoking a year ago but clinically seems to be actively smoking  Past Medical History:  Diagnosis Date  . Affective disorder (HCC) 12/12/2012  . Affective disorder, major 07/10/2013  . BPH (benign prostatic hyperplasia)   . Chronic chest wall pain 07/01/2017  . DDD (degenerative disc disease), cervical 12/12/2012  . Depression   . Hx of hepatitis C   . Impotence   . Inability to urinate   . Mononeuropathy of upper extremity 12/12/2012  . Panic disorder without agoraphobia 04/18/2013  . Peyronie's disease   . Rectal polyp 06/09/2016  . Sebaceous cyst 06/09/2016  . Urinary frequency     Past Surgical History:  Procedure Laterality Date  . APPENDECTOMY    . CERVICAL SPINE SURGERY    . HERNIA REPAIR      Family History  Problem Relation Age of Onset  . Heart disease Father   . Breast cancer Sister   . Leukemia Mother   . Diabetes Maternal Grandmother   . Colon cancer Paternal Grandfather   . Heart Problems Maternal Grandfather   . Prostate cancer Neg Hx     Social History:  reports that he quit smoking about 14 months ago. He quit after 20.00 years of use. he has never used smokeless tobacco. He reports that he drinks alcohol. He reports that he does not use drugs.  Allergies:   Allergies  Allergen Reactions  . Codeine Rash    Other reaction(s): Vomiting    Medications reviewed.     ROS Full ROS performed and is otherwise negative other than what is stated in the HPI    BP 118/69   Pulse 76   Temp 98.1 F (36.7 C) (Oral)   Ht 5\' 7"  (1.702 m)   Wt 57.2 kg (126 lb)   BMI 19.73 kg/m   Physical Exam  Constitutional: He is oriented to person, place, and time and well-developed, well-nourished, and in no distress. No distress.  Neck: No JVD present.  Pulmonary/Chest: Effort normal. No stridor. No respiratory distress. He exhibits no tenderness.  Abdominal: Soft. There is no tenderness. There is no rebound and no guarding.  Neurological: He is alert and oriented to person, place, and time. Gait normal. GCS score is 15.  Skin: Skin is warm and dry. He is not diaphoretic.  4 cm EIC lower thoracic area, to the left of the midline. NO evidence of infection or erythema  Nursing note and vitals reviewed.    Assessment/Plan: Symptomatic moderate sized epidermal inclusion cyst. Discussed with the patient in detail about options of excision versus observation. Patient is adamant and wishes to have this cyst excised today. Discussed with the patient about the procedure, risks, benefits and possible complications including but not limited  to: Bleeding, infection, recurrence, pain. He understands and wishes to proceed.   PROCEDURE NOTE  PREOP DX: EIC BACK  POST OP DX: SAME  PROCEDURE: 1. Excision of a 4.5 cm subcutaneous tissue cyst on his back 2. Intermediate layer closure of a 4.5 cm wound on his back  Anesthesia: Lidocaine 1% with epi  EBL: minimal  Complication: none  Findings: EIC  Patient was placed in the prone position, prepped and  Draped in the usual sterile fashion. Local anesthetic was infiltrated. Elliptical incision was created with a 15 blade knife and dissection was continued to separate this tissue to incorporate all the edges of  the cyst. The cyst was excised after further dissection was performed with Metzenbaum scissors. Specimen was sent for permanent pathology. Cautery was used to obtain hemostasis. The wound was closed in a 2 layer fashion with a 3-0 Vicryl and a 4-0 Vicryl. Dermabond was placed. No complications.    Sterling Bigiego Pabon, MD [facility] General Surgeon

## 2017-09-02 ENCOUNTER — Telehealth: Payer: Self-pay

## 2017-09-02 LAB — PATHOLOGY

## 2017-09-02 NOTE — Telephone Encounter (Signed)
Pathology results obtained and scanned into patient's chart. Results can be found under Media Tab.

## 2017-09-02 NOTE — Telephone Encounter (Signed)
Called patient to let him know that his pathology report cam back and it showed epidermal cyst (benign). I asked the patient if he was doing well and he stated that he was. I told him that if he had any questions or concerns, to please give us a call. Patient agreed.

## 2017-09-21 ENCOUNTER — Ambulatory Visit: Payer: Self-pay | Admitting: Surgery

## 2017-09-26 ENCOUNTER — Telehealth: Payer: Self-pay

## 2017-09-26 NOTE — Telephone Encounter (Signed)
Patient called requesting a refill on meloxicam.  He reports that the medication helped his pain that he was having.

## 2017-09-27 NOTE — Telephone Encounter (Signed)
No VM sey up pt had refill send in 11/18 with 2 refill he may need to call pharmacy for refill.

## 2017-10-03 ENCOUNTER — Ambulatory Visit: Payer: Self-pay | Admitting: Surgery

## 2017-10-26 DIAGNOSIS — G5692 Unspecified mononeuropathy of left upper limb: Secondary | ICD-10-CM | POA: Diagnosis not present

## 2017-10-26 DIAGNOSIS — M5030 Other cervical disc degeneration, unspecified cervical region: Secondary | ICD-10-CM | POA: Diagnosis not present

## 2017-11-09 ENCOUNTER — Ambulatory Visit: Payer: Self-pay | Admitting: Surgery

## 2017-12-13 ENCOUNTER — Ambulatory Visit: Payer: Self-pay | Admitting: Surgery

## 2017-12-19 ENCOUNTER — Other Ambulatory Visit: Payer: Self-pay | Admitting: Family Medicine

## 2017-12-19 DIAGNOSIS — R0789 Other chest pain: Principal | ICD-10-CM

## 2017-12-19 DIAGNOSIS — M940 Chondrocostal junction syndrome [Tietze]: Secondary | ICD-10-CM

## 2017-12-19 DIAGNOSIS — G8929 Other chronic pain: Secondary | ICD-10-CM

## 2018-01-04 ENCOUNTER — Ambulatory Visit: Payer: Self-pay | Admitting: Surgery

## 2018-06-08 ENCOUNTER — Telehealth: Payer: Self-pay | Admitting: Family Medicine

## 2018-06-08 NOTE — Telephone Encounter (Signed)
I attempted to contact the pt to triage his chest pain. No answer or vm. After several rings an automatic message stated the wireless customer you are trying to call is not available to please try your call again later.

## 2018-06-08 NOTE — Telephone Encounter (Signed)
Pt was was prescribed something for chest pain previously and is having the same pain now sometimes for hours until it subsides.  He can't get transportation until Tuesday.  He asked if he could get something called in to help with pain until appt on Tuesday [phone]

## 2018-06-09 ENCOUNTER — Other Ambulatory Visit: Payer: Self-pay | Admitting: Family Medicine

## 2018-06-09 DIAGNOSIS — G8929 Other chronic pain: Secondary | ICD-10-CM

## 2018-06-09 DIAGNOSIS — M940 Chondrocostal junction syndrome [Tietze]: Secondary | ICD-10-CM

## 2018-06-09 DIAGNOSIS — R0789 Other chest pain: Principal | ICD-10-CM

## 2018-06-09 MED ORDER — MELOXICAM 15 MG PO TABS: ORAL_TABLET | ORAL | 2 refills | Status: DC

## 2018-06-09 NOTE — Telephone Encounter (Addendum)
Agree with documentation below. I last saw him 07/01/17 for same complaint, he was given rx meloxicam, which was successful for his MSK related atypical chest pain. He got this refilled 09/2017 as well.  He will need to return in future for more refills. He is scheduled to see me on 06/13/18 for same problem. He should be aware from our previous discussion in office that if any severe worsening or new symptom related exertional chest pain should be seen at hospital for more acute evaluation.  Saralyn PilarAlexander Marijayne Rauth, DO [facility] 06/09/2018, 12:34 PM

## 2018-06-09 NOTE — Telephone Encounter (Signed)
As per Dr. Kirtland BouchardK Rx for meloxicam was send to [facility], when patient called phone was either disconnected or he hang up, unable to reach the patient has same recording.

## 2018-06-13 ENCOUNTER — Ambulatory Visit: Payer: Medicare Other | Admitting: Family Medicine

## 2018-06-20 ENCOUNTER — Ambulatory Visit (INDEPENDENT_AMBULATORY_CARE_PROVIDER_SITE_OTHER): Payer: Medicare Other | Admitting: Family Medicine

## 2018-06-20 ENCOUNTER — Encounter: Payer: Self-pay | Admitting: Family Medicine

## 2018-06-20 VITALS — BP 136/87 | HR 89 | Temp 98.1°F | Resp 16 | Ht 65.0 in | Wt 121.6 lb

## 2018-06-20 DIAGNOSIS — F410 Panic disorder [episodic paroxysmal anxiety] without agoraphobia: Secondary | ICD-10-CM

## 2018-06-20 DIAGNOSIS — F39 Unspecified mood [affective] disorder: Secondary | ICD-10-CM

## 2018-06-20 DIAGNOSIS — R0789 Other chest pain: Secondary | ICD-10-CM | POA: Diagnosis not present

## 2018-06-20 DIAGNOSIS — N401 Enlarged prostate with lower urinary tract symptoms: Secondary | ICD-10-CM | POA: Diagnosis not present

## 2018-06-20 DIAGNOSIS — G8929 Other chronic pain: Secondary | ICD-10-CM

## 2018-06-20 DIAGNOSIS — G894 Chronic pain syndrome: Secondary | ICD-10-CM | POA: Diagnosis not present

## 2018-06-20 MED ORDER — TAMSULOSIN HCL 0.4 MG PO CAPS: 0.4000 mg | ORAL_CAPSULE | Freq: Every day | ORAL | 3 refills | Status: DC

## 2018-06-20 MED ORDER — ALPRAZOLAM 1 MG PO TABS: 1.0000 mg | ORAL_TABLET | Freq: Every day | ORAL | 0 refills | Status: DC | PRN

## 2018-06-20 NOTE — Patient Instructions (Addendum)
Thank you for coming to the office today.  Stay tuned for apt from specialists - [facility] (Cardiology) and Pain Management  [facility] [address] [place], [place] [address] Main: [phone]   [facility] Address: [address], [place], [place] [address] Phone: [phone]  As discussed, I am concerned enough about your panic and anxiety, that I think you would benefit from treatment, and offered several medications that you are not comfortable with. Take the same medicine before Xanax as needed temporary I cannot prescribe this long term, I will recommend that you see a Psychiatrist or mental health professional next.  [facility](Behavioral Health) [place] [address], [place], [place] [address] Phone: [phone]  [facility], available walk-in 9am-4pm M-F [address] [place], [place] [address] Hours: 9am - 4pm (M-F, walk in available) [phone]  ------------------------------  [facility] (All [facility]) [address], [address] [place], [id number] Phone: [phone] (Option 1) [email or web address]  DUE for FASTING BLOOD WORK (no food or drink after midnight before the lab appointment, only water or coffee without cream/sugar on the morning of)  SCHEDULE "Lab Only" visit in the morning at the clinic for lab draw in 4 to 6 MONTHS   - Make sure Lab Only appointment is at about 1 week before your next appointment, so that results will be available  For Lab Results, once available within 2-3 days of blood draw, you can can log in to MyChart online to view your results and a brief explanation. Also, we can discuss results at next follow-up visit.  Please schedule a Follow-up Appointment to: Return in about 5 months (around 11/20/2018) for Annual Physical (f/u specialists Cards, Pain, Psych).  If you have any other questions or concerns,  please feel free to call the office or send a message through MyChart. You may also schedule an earlier appointment if necessary.  Additionally, you may be receiving a survey about your experience at our office within a few days to 1 week by e-mail or mail. We value your feedback.  Saralyn PilarAlexander Bear Osten, DO [facility]

## 2018-06-20 NOTE — Progress Notes (Signed)
Subjective:    Patient ID: Dalton Fleming, male    DOB: 1951-06-18, 67 y.o.   MRN: [id number]  Dalton Fleming is a 67 y.o. male presenting on 06/20/2018 for chronic chest pain (10 years as per patient was Rx Meloxicam in past) and Back Pain   HPI  Chronic Atypical Chest Wall Pain / Chronic Pain Syndrome / Cervical DJD s/p surgery Last visit with me here for same problem 06/2017, see note for detailed background history on this chronic issue >10+ years, he had extensive work-up in past including Cardiology in 2017. He has been followed by [facility] as well, with known chronic pain DJD C-spine problem with prior surgery and chronic nerve pain, he has been on regimen of opiates and other medications for long time with benefit and improved function. - Interval updates - previously chest pain was treated with Meloxicam with good results, he continued this for long time and did not follow back up for further evaluation, now it seems to be less effective for him - Other update, he is no longer followed by chronic pain management, he cannot travel to [facility] anymore and unable to keep some apt and therefore discharged from their practice - He has not returned to Cardiology or other specialist for this problem  Today he reports same issue with same chest wall L sided pain, and neck pain and L shoulder pain. Also area on his back where he had cyst removed by [facility] in 2018, he states thinks "not completely removed" or wanted it looked at again he never followed up with them - He describes same pain, reproducible often worse with prolonged laying on L side worse at night keep him awake - He admits has hardware in his neck from spine surgery, he has done PT in past for neck and this caused worse problem with his L side and shoulder - He has been off of opiates for few months - Denies any exertional chest pain or symptoms, chest tightness or pressure, heartburn,  Nausea vomiting,  diarrhea, cough, dyspnea, near syncope or syncope, blood in stool, dark stools  Chronic Anxiety with Panic / Mood Disorder He also has complex history of some anxiety and mood related to his chronic pain and problems, he was evaluated in past and he states given initial dx of OCD but he disagreed with this and followed up with other provides in past, his pain management providers were giving him Xanax BDZ PRN for panic attacks with good results, his anxiety was controlled for a while. - he has been off of Xanax for few months now as well. He had worsening anxiety and panic with x 4 episodes in past month - Other medications he has tried include some SSRI and Latuda, did not tolerate well and he states were not controlling his anxiety  BPH - followed in past by [facility] last visit 2017, he was treated with flomax and finasteride but was lost to follow-up, and asking for refill today Admits lower urinary tract symptoms hesitancy frequency Denies urinary retention   Depression screen [facility] 2/9 06/20/2018 06/10/2016  Decreased Interest 1 3  Down, Depressed, Hopeless 0 3  PHQ - 2 Score 1 6  Altered sleeping 3 3  Tired, decreased energy 3 3  Change in appetite 0 3  Feeling bad or failure about yourself  0 3  Trouble concentrating 0 3  Moving slowly or fidgety/restless 1 0  Suicidal thoughts 0 1  PHQ-9  Score 8 22  Difficult doing work/chores Not difficult at all Extremely dIfficult   GAD 7 : Generalized Anxiety Score 06/20/2018  Nervous, Anxious, on Edge 0  Control/stop worrying 1  Worry too much - different things 2  Trouble relaxing 2  Restless 0  Easily annoyed or irritable 0  Afraid - awful might happen 0  Total GAD 7 Score 5  Anxiety Difficulty Not difficult at all     Social History   Tobacco Use  . Smoking status: Current Every Day Smoker    Years: 20.00    Last attempt to quit: 06/27/2016    Years since quitting: 1.9  . Smokeless tobacco: Current User  . Tobacco comment:  pack in 3 days trying to quit  Substance Use Topics  . Alcohol use: Yes    Alcohol/week: 0.0 standard drinks    Comment: occassional  . Drug use: Yes    Types: Marijuana    Review of Systems Per HPI unless specifically indicated above     Objective:    BP 136/87   Pulse 89   Temp 98.1 F (36.7 C) (Oral)   Resp 16   Ht 5\' 5"  (1.651 m)   Wt 121 lb 9.6 oz (55.2 kg)   SpO2 99%   BMI 20.24 kg/m   Wt Readings from Last 3 Encounters:  06/20/18 121 lb 9.6 oz (55.2 kg)  08/30/17 126 lb (57.2 kg)  07/01/17 114 lb (51.7 kg)    Physical Exam  Constitutional: He is oriented to person, place, and time. He appears well-developed and well-nourished. No distress.  Chronically ill and thin appearing 67 yr old male, comfortable, cooperative  HENT:  Head: Normocephalic and atraumatic.  Mouth/Throat: Oropharynx is clear and moist.  Eyes: Conjunctivae are normal. Right eye exhibits no discharge. Left eye exhibits no discharge.  Neck: Normal range of motion. Neck supple. No thyromegaly present.  No carotid bruits  Cardiovascular: Normal rate, regular rhythm, normal heart sounds and intact distal pulses.  No murmur heard. Pulmonary/Chest: Effort normal and breath sounds normal. No respiratory distress. He has no wheezes. He has no rales. He exhibits tenderness (Left lower sternal border with notable bony prominence and reproducible tenderness).  Abdominal: Soft. Bowel sounds are normal. He exhibits no distension and no mass. There is no tenderness. There is no rebound and no guarding.  Non tender RUQ. Negative Murphy's on deep inspiration.  Musculoskeletal: Normal range of motion. He exhibits no edema.  Lymphadenopathy:    He has no cervical adenopathy.  Neurological: He is alert and oriented to person, place, and time.  Distal sensation intact light touch  Skin: Skin is warm and dry. No rash noted. He is not diaphoretic. No erythema.  Psychiatric: He has a normal mood and affect. His  behavior is normal.  Well groomed, good eye contact, normal speech and thoughts  Nursing note and vitals reviewed.      Assessment & Plan:   Problem List Items Addressed This Visit    Affective disorder (HCC)    Uncertain exact diagnosis, concern mixed mood/anxiety with his panic See A&P for anxiety - Emphasized that he should ultimately establish with mental health provider in future given complexity of his history and prior medication treatments in past      BPH (benign prostatic hyperplasia)    Chronic BPH problem with LUTS Previous [facility] Was on Finasteride and Flomax Lost to follow-up  Advised him to re-schedule, but today agreed to manage with Flomax as he  states this was effective, refill 0.4mg  daily sent to pharmacy      Relevant Medications   tamsulosin (FLOMAX) 0.4 MG CAPS capsule   Chronic chest wall pain - Primary    Similar to last evaluation almost 1 year ago - Suspected to be more MSK etiology with chronic costochondritis with history of prior rib fractures vs other etiology, reproducible, atypical features, negative nuclear stress and not seem cardiac in nature, no longer following with Cardiology last visit 2017. Also no significant GI symptoms, not indicative of cholelithiasis or cholecystitis, no heartburn or PUD. - Imaging on chart: CXR 05/2016, shows COPD and old rib fractures. RUQ [place] in 2015 showed normal GB no cholelithiasis  Clinically today appears well, reassuring, hemodynamically stable, no other associated symptoms, reproducible  Plan: 1. He may continue Meloxicam - has been refilled recently, despite recent less effective 2. Referral today back to [facility] for re-evaluation given no longer responding to [facility] pain management again to have 2nd opinion to help [place] rule out cardiac and other etiology prior to pursuing aggressive pain management again 3. Referral also to [facility] Pain management, to resume care and maybe other interventions given prior  improvement from [facility], now cannot travel to [facility] May use Tylenol PRN breakthrough Trial topical muscle rub as well, heating pad Future considered alternative meds - declines change today, will defer to pain management at this time, offered him Cymbalta / Duloxetine but he declined this today Return criteria given if acute worsening CP and other symptoms      Relevant Orders   Ambulatory referral to Cardiology   Chronic pain syndrome    See A&P Refer to [facility]      Relevant Orders   Ambulatory referral to Pain Clinic   Panic disorder without agoraphobia    Clinically concern for chronic generalized anxiety with panic disorder, having recent worsening panic attacks Complicated by possible underlying mood disorder, uncertain if appropriate diagnosed in past Also complicated by chronic pain affecting his function Prior Psych eval before, none recently Failed multiple medications, unsure complete list - includes some SSRI, Latuda Prior long term BDZ PRN with Xanax for panic with good results, worse after off Checked [place] CSRS PMP AWARE, last fill from prior pain management [facility] in 12/2017  Plan Discussed alternative medicines for anxiety more stabilizing and less reliance on BDZ PRN - ultimately he declined Cymbalta (SNRI) I offered for pain and also mood/anxiety, he would prefer not to start new med right now - I agreed to provide short term temporary Xanax PRN for panic attacks while he is referred and re-established with some local specialists, gave him printed handout with self referral local Psychiatry options with [facility] as well, encourage him to get evaluated and may benefit from therapist as well       Relevant Medications   ALPRAZolam (XANAX) 1 MG tablet   Other Relevant Orders   Ambulatory referral to Pain Clinic     #Cyst on Back Previously referred to Gen Surg Dr Everlene Farrier had removal excision performed He still complains of  problem Asked him to follow back up with Surgery to discuss options and may need 2nd opinion  Meds ordered this encounter  Medications  . tamsulosin (FLOMAX) 0.4 MG CAPS capsule    Sig: Take 1 capsule (0.4 mg total) by mouth daily.    Dispense:  90 capsule    Refill:  3  . ALPRAZolam (XANAX) 1 MG tablet  Sig: Take 1 tablet (1 mg total) by mouth daily as needed for anxiety (panic).    Dispense:  15 tablet    Refill:  0   Orders Placed This Encounter  Procedures  . Ambulatory referral to Cardiology    Referral Priority:   Routine    Referral Type:   Consultation    Referral Reason:   Specialty Services Required    Requested Specialty:   Cardiology    Number of Visits Requested:   1  . Ambulatory referral to Pain Clinic    Referral Priority:   Routine    Referral Type:   Consultation    Referral Reason:   Specialty Services Required    Requested Specialty:   Pain Medicine    Number of Visits Requested:   1    Follow up plan: Return in about 5 months (around 11/20/2018) for Annual Physical (f/u specialists Cards, Pain, Psych).  Future labs ordered for 11/22/18  Saralyn PilarAlexander Jahnia Hewes, DO [facility] 06/20/2018, 6:28 PM

## 2018-06-21 ENCOUNTER — Other Ambulatory Visit: Payer: Self-pay | Admitting: Family Medicine

## 2018-06-21 DIAGNOSIS — D649 Anemia, unspecified: Secondary | ICD-10-CM

## 2018-06-21 DIAGNOSIS — M5030 Other cervical disc degeneration, unspecified cervical region: Secondary | ICD-10-CM

## 2018-06-21 DIAGNOSIS — F410 Panic disorder [episodic paroxysmal anxiety] without agoraphobia: Secondary | ICD-10-CM

## 2018-06-21 DIAGNOSIS — G8929 Other chronic pain: Secondary | ICD-10-CM

## 2018-06-21 DIAGNOSIS — R0789 Other chest pain: Secondary | ICD-10-CM

## 2018-06-21 DIAGNOSIS — N401 Enlarged prostate with lower urinary tract symptoms: Secondary | ICD-10-CM

## 2018-06-21 DIAGNOSIS — Z0000 Encounter for general adult medical examination without abnormal findings: Secondary | ICD-10-CM

## 2018-06-21 DIAGNOSIS — R799 Abnormal finding of blood chemistry, unspecified: Secondary | ICD-10-CM

## 2018-06-21 DIAGNOSIS — E7800 Pure hypercholesterolemia, unspecified: Secondary | ICD-10-CM

## 2018-06-21 DIAGNOSIS — Z1159 Encounter for screening for other viral diseases: Secondary | ICD-10-CM

## 2018-06-21 DIAGNOSIS — F39 Unspecified mood [affective] disorder: Secondary | ICD-10-CM

## 2018-06-21 DIAGNOSIS — G894 Chronic pain syndrome: Secondary | ICD-10-CM

## 2018-06-21 NOTE — Addendum Note (Signed)
Addended by: Smitty CordsKARAMALEGOS, ALEXANDER J on: 06/21/2018 09:06 AM   Modules accepted: Orders

## 2018-06-21 NOTE — Assessment & Plan Note (Signed)
Clinically concern for chronic generalized anxiety with panic disorder, having recent worsening panic attacks Complicated by possible underlying mood disorder, uncertain if appropriate diagnosed in past Also complicated by chronic pain affecting his function Prior Psych eval before, none recently Failed multiple medications, unsure complete list - includes some SSRI, Latuda Prior long term BDZ PRN with Xanax for panic with good results, worse after off Checked [place] CSRS PMP AWARE, last fill from prior pain management [facility] in 12/2017  Plan Discussed alternative medicines for anxiety more stabilizing and less reliance on BDZ PRN - ultimately he declined Cymbalta (SNRI) I offered for pain and also mood/anxiety, he would prefer not to start new med right now - I agreed to provide short term temporary Xanax PRN for panic attacks while he is referred and re-established with some local specialists, gave him printed handout with self referral local Psychiatry options with [facility] as well, encourage him to get evaluated and may benefit from therapist as well

## 2018-06-21 NOTE — Assessment & Plan Note (Signed)
See A&P [facility]er to [facility]

## 2018-06-21 NOTE — Assessment & Plan Note (Signed)
Similar to last evaluation almost 1 year ago - Suspected to be more MSK etiology with chronic costochondritis with history of prior rib fractures vs other etiology, reproducible, atypical features, negative nuclear stress and not seem cardiac in nature, no longer following with Cardiology last visit 2017. Also no significant GI symptoms, not indicative of cholelithiasis or cholecystitis, no heartburn or PUD. - Imaging on chart: CXR 05/2016, shows COPD and old rib fractures. RUQ US in 2015 showed normal GB no cholelithiasis  Clinically today appears well, reassuring, hemodynamically stable, no other associated symptoms, reproducible  Plan: 1. He may continue Meloxicam - has been refilled recently, despite recent less effective 2. Ref[facility] today [facility] for re-evaluation given no longer responding to [facility] pain management again to have 2nd opinion to help us rule out cardiac and other etiology prior to pursuing aggressive pain management again 3. Referral also to [facility], to resume care and maybe other interventions given prior improvement from [facility], [facility] May use Tylenol PRN breakthrough Trial topical muscle rub as well, heating pad Future considered alternative meds - declines change today, will defer to pain management at this time, offered him Cymbalta / Duloxetine but he declined this today Return criteria given if acute worsening CP and other symptoms

## 2018-06-21 NOTE — Assessment & Plan Note (Signed)
Chronic BPH problem with LUTS Previous [facility] Was on Finasteride and Flomax Lost to follow-up  Advised him to re-schedule, but today agreed to manage with Flomax as he states this was effective, refill 0.4mg  daily sent to pharmacy

## 2018-06-21 NOTE — Assessment & Plan Note (Addendum)
Uncertain exact diagnosis, concern mixed mood/anxiety with his panic See A&P for anxiety - Emphasized that he should ultimately establish with mental health provider in future given complexity of his history and prior medication treatments in past

## 2018-07-07 ENCOUNTER — Telehealth: Payer: Self-pay | Admitting: Cardiovascular Disease

## 2018-07-07 NOTE — Telephone Encounter (Signed)
Lmov for patient to call back  They were scheduled to see Dr Mariah MillingGollan on 07/19/18 Change in schedule Will try again to reschedule this appointment

## 2018-07-19 ENCOUNTER — Ambulatory Visit: Payer: Medicare Other | Admitting: Cardiovascular Disease

## 2018-11-02 ENCOUNTER — Telehealth: Payer: Self-pay | Admitting: Family Medicine

## 2018-11-02 NOTE — Telephone Encounter (Signed)
Could you call patient back to clarify what particularly is the medical reason for him not being able to serve on jury duty? - Most likely this will be his Chronic Pain, specifically Neck and Back Pain. - Also, I would need to know how this limits him - for instance if he is unable to sit for prolonged periods due to pain.  Lastly - is he interested only in missing this one instance - but thinks he may be able to serve again? Or does he prefer an attempt at a more long-term or permanent excuse if they will allow it?  Saralyn Pilar, DO [facility] 11/02/2018, 4:17 PM

## 2018-11-02 NOTE — Telephone Encounter (Signed)
As per patient has chronic neck pain, back pain and shoulder pain also has Hx of neck surgery done in past. He is unable to sit for prolonged periods due to pain and he is interested in long-term or permanent excuse if they can allow.

## 2018-11-02 NOTE — Telephone Encounter (Addendum)
Pt need a note to get out of jury duty 11/28/2018.  His call back number is [phone]

## 2018-11-03 ENCOUNTER — Encounter: Payer: Self-pay | Admitting: Family Medicine

## 2018-11-03 NOTE — Telephone Encounter (Signed)
Reviewed chart. Letter written for [employer]. Patient may pick it up any time starting today Friday 11/03/18.  Saralyn Pilar, DO [facility] 11/03/2018, 8:20 AM

## 2018-11-22 ENCOUNTER — Other Ambulatory Visit: Payer: Medicare Other

## 2018-11-29 ENCOUNTER — Encounter: Payer: Medicare Other | Admitting: Family Medicine

## 2019-01-25 ENCOUNTER — Other Ambulatory Visit: Payer: Self-pay | Admitting: Family Medicine

## 2019-01-25 DIAGNOSIS — R0789 Other chest pain: Principal | ICD-10-CM

## 2019-01-25 DIAGNOSIS — M940 Chondrocostal junction syndrome [Tietze]: Secondary | ICD-10-CM

## 2019-01-25 DIAGNOSIS — G8929 Other chronic pain: Secondary | ICD-10-CM

## 2019-04-16 ENCOUNTER — Other Ambulatory Visit: Payer: Self-pay | Admitting: Family Medicine

## 2019-04-16 DIAGNOSIS — M940 Chondrocostal junction syndrome [Tietze]: Secondary | ICD-10-CM

## 2019-04-16 DIAGNOSIS — G8929 Other chronic pain: Secondary | ICD-10-CM

## 2019-05-20 ENCOUNTER — Other Ambulatory Visit: Payer: Self-pay | Admitting: Family Medicine

## 2019-05-20 DIAGNOSIS — N401 Enlarged prostate with lower urinary tract symptoms: Secondary | ICD-10-CM

## 2019-08-16 ENCOUNTER — Other Ambulatory Visit: Payer: Self-pay | Admitting: Family Medicine

## 2019-08-16 DIAGNOSIS — N401 Enlarged prostate with lower urinary tract symptoms: Secondary | ICD-10-CM

## 2019-08-25 ENCOUNTER — Encounter
Admission: EM | Disposition: A | Discharge status: Home / Self Care | Payer: Self-pay | Source: Home / Self Care | Attending: Internal Medicine

## 2019-08-25 ENCOUNTER — Other Ambulatory Visit: Payer: Self-pay

## 2019-08-25 ENCOUNTER — Observation Stay
Admission: EM | Admit: 2019-08-25 | Discharge: 2019-08-27 | Disposition: A | Discharge status: Home / Self Care | Payer: Medicare Other | Attending: Internal Medicine | Admitting: Internal Medicine

## 2019-08-25 ENCOUNTER — Emergency Department: Payer: Medicare Other

## 2019-08-25 DIAGNOSIS — N400 Enlarged prostate without lower urinary tract symptoms: Secondary | ICD-10-CM | POA: Diagnosis not present

## 2019-08-25 DIAGNOSIS — R627 Adult failure to thrive: Secondary | ICD-10-CM | POA: Insufficient documentation

## 2019-08-25 DIAGNOSIS — I499 Cardiac arrhythmia, unspecified: Secondary | ICD-10-CM | POA: Diagnosis not present

## 2019-08-25 DIAGNOSIS — F329 Major depressive disorder, single episode, unspecified: Secondary | ICD-10-CM | POA: Diagnosis not present

## 2019-08-25 DIAGNOSIS — R569 Unspecified convulsions: Secondary | ICD-10-CM | POA: Insufficient documentation

## 2019-08-25 DIAGNOSIS — F17200 Nicotine dependence, unspecified, uncomplicated: Secondary | ICD-10-CM | POA: Insufficient documentation

## 2019-08-25 DIAGNOSIS — Z7982 Long term (current) use of aspirin: Secondary | ICD-10-CM | POA: Insufficient documentation

## 2019-08-25 DIAGNOSIS — Z7289 Other problems related to lifestyle: Secondary | ICD-10-CM | POA: Diagnosis not present

## 2019-08-25 DIAGNOSIS — J439 Emphysema, unspecified: Secondary | ICD-10-CM | POA: Insufficient documentation

## 2019-08-25 DIAGNOSIS — M5030 Other cervical disc degeneration, unspecified cervical region: Secondary | ICD-10-CM | POA: Diagnosis not present

## 2019-08-25 DIAGNOSIS — M6281 Muscle weakness (generalized): Secondary | ICD-10-CM | POA: Insufficient documentation

## 2019-08-25 DIAGNOSIS — Z79899 Other long term (current) drug therapy: Secondary | ICD-10-CM | POA: Diagnosis not present

## 2019-08-25 DIAGNOSIS — Z791 Long term (current) use of non-steroidal anti-inflammatories (NSAID): Secondary | ICD-10-CM | POA: Diagnosis not present

## 2019-08-25 DIAGNOSIS — I319 Disease of pericardium, unspecified: Secondary | ICD-10-CM

## 2019-08-25 DIAGNOSIS — R55 Syncope and collapse: Secondary | ICD-10-CM | POA: Diagnosis not present

## 2019-08-25 DIAGNOSIS — I213 ST elevation (STEMI) myocardial infarction of unspecified site: Secondary | ICD-10-CM | POA: Diagnosis not present

## 2019-08-25 DIAGNOSIS — Z743 Need for continuous supervision: Secondary | ICD-10-CM | POA: Diagnosis not present

## 2019-08-25 DIAGNOSIS — E860 Dehydration: Secondary | ICD-10-CM

## 2019-08-25 DIAGNOSIS — Z20828 Contact with and (suspected) exposure to other viral communicable diseases: Secondary | ICD-10-CM | POA: Diagnosis not present

## 2019-08-25 DIAGNOSIS — G894 Chronic pain syndrome: Secondary | ICD-10-CM | POA: Diagnosis not present

## 2019-08-25 DIAGNOSIS — Z885 Allergy status to narcotic agent status: Secondary | ICD-10-CM | POA: Insufficient documentation

## 2019-08-25 DIAGNOSIS — I959 Hypotension, unspecified: Secondary | ICD-10-CM | POA: Diagnosis not present

## 2019-08-25 DIAGNOSIS — Z03818 Encounter for observation for suspected exposure to other biological agents ruled out: Secondary | ICD-10-CM | POA: Diagnosis not present

## 2019-08-25 LAB — CBC WITH DIFFERENTIAL/PLATELET
Abs Immature Granulocytes: 0.03 10*3/uL (ref 0.00–0.07)
Basophils Absolute: 0.1 10*3/uL (ref 0.0–0.1)
Basophils Relative: 1 %
Eosinophils Absolute: 0.1 10*3/uL (ref 0.0–0.5)
Eosinophils Relative: 2 %
HCT: 46.5 % (ref 39.0–52.0)
Hemoglobin: 15 g/dL (ref 13.0–17.0)
Immature Granulocytes: 0 %
Lymphocytes Relative: 37 %
Lymphs Abs: 2.6 10*3/uL (ref 0.7–4.0)
MCH: 26.9 pg (ref 26.0–34.0)
MCHC: 32.3 g/dL (ref 30.0–36.0)
MCV: 83.3 fL (ref 80.0–100.0)
Monocytes Absolute: 0.4 10*3/uL (ref 0.1–1.0)
Monocytes Relative: 6 %
Neutro Abs: 3.9 10*3/uL (ref 1.7–7.7)
Neutrophils Relative %: 54 %
Platelets: 261 10*3/uL (ref 150–400)
RBC: 5.58 MIL/uL (ref 4.22–5.81)
RDW: 13.2 % (ref 11.5–15.5)
WBC: 7.2 10*3/uL (ref 4.0–10.5)
nRBC: 0 % (ref 0.0–0.2)

## 2019-08-25 LAB — COMPREHENSIVE METABOLIC PANEL
ALT: 11 U/L (ref 0–44)
AST: 16 U/L (ref 15–41)
Albumin: 4.5 g/dL (ref 3.5–5.0)
Alkaline Phosphatase: 41 U/L (ref 38–126)
Anion gap: 12 (ref 5–15)
BUN: 17 mg/dL (ref 8–23)
CO2: 26 mmol/L (ref 22–32)
Calcium: 9 mg/dL (ref 8.9–10.3)
Chloride: 102 mmol/L (ref 98–111)
Creatinine, Ser: 1.03 mg/dL (ref 0.61–1.24)
GFR calc Af Amer: >60 mL/min (ref >60)
GFR calc non Af Amer: >60 mL/min (ref >60)
Glucose, Bld: 134 mg/dL — ABNORMAL HIGH (ref 70–99)
Potassium: 4.4 mmol/L (ref 3.5–5.1)
Sodium: 140 mmol/L (ref 135–145)
Total Bilirubin: 0.9 mg/dL (ref 0.3–1.2)
Total Protein: 7.6 g/dL (ref 6.5–8.1)

## 2019-08-25 LAB — SALICYLATE LEVEL: Salicylate Lvl: <7 mg/dL (ref 2.8–30.0)

## 2019-08-25 LAB — LACTIC ACID, PLASMA: Lactic Acid, Venous: 1.7 mmol/L (ref 0.5–1.9)

## 2019-08-25 LAB — LIPASE, BLOOD: Lipase: 22 U/L (ref 11–51)

## 2019-08-25 LAB — BRAIN NATRIURETIC PEPTIDE: B Natriuretic Peptide: 10 pg/mL (ref 0.0–100.0)

## 2019-08-25 LAB — TROPONIN I (HIGH SENSITIVITY): Troponin I (High Sensitivity): 2 ng/L (ref <18)

## 2019-08-25 LAB — ETHANOL: Alcohol, Ethyl (B): <10 mg/dL (ref <10)

## 2019-08-25 LAB — ACETAMINOPHEN LEVEL: Acetaminophen (Tylenol), Serum: <10 ug/mL — ABNORMAL LOW (ref 10–30)

## 2019-08-25 SURGERY — CORONARY/GRAFT ACUTE MI REVASCULARIZATION
Anesthesia: Moderate Sedation

## 2019-08-25 MED ORDER — BIVALIRUDIN TRIFLUOROACETATE 250 MG IV SOLR
INTRAVENOUS | Status: AC
  Filled 2019-08-25: qty 250

## 2019-08-25 MED ORDER — MIDAZOLAM HCL 2 MG/2ML IJ SOLN
INTRAMUSCULAR | Status: AC
  Filled 2019-08-25: qty 2

## 2019-08-25 MED ORDER — FENTANYL CITRATE (PF) 100 MCG/2ML IJ SOLN
INTRAMUSCULAR | Status: AC
  Filled 2019-08-25: qty 2

## 2019-08-25 MED ORDER — NITROGLYCERIN 1 MG/10 ML FOR IR/CATH LAB
INTRA_ARTERIAL | Status: AC
  Filled 2019-08-25: qty 10

## 2019-08-25 MED ORDER — ASPIRIN 81 MG PO CHEW
CHEWABLE_TABLET | ORAL | Status: AC
  Filled 2019-08-25: qty 4

## 2019-08-25 MED ORDER — CLOPIDOGREL BISULFATE 300 MG PO TABS
ORAL_TABLET | ORAL | Status: AC
  Filled 2019-08-25: qty 2

## 2019-08-25 NOTE — Consult Note (Signed)
STEMI called from the field with abnormal EKG Thought to have diffuse ST elevation no significant reciprocal depression Patient was not have any chest pain Hemodynamically stable in emergency room After evaluation of the patient and EKG I determined this was probably not a code STEMI EKG is more consistent with possible inflammation and possible pericarditis Patient was pain-free on my evaluation in emergency room Laboratories were still pending Case discussed with the Cath Lab staff as well as emergency room physician Dr. Rip Harbour Would recommend evaluation for nonischemic chest pain Consider laboratories imaging echo for possible pericarditis Do not recommend cardiac cath at this point Conservative cardiac input at this point

## 2019-08-25 NOTE — ED Notes (Signed)
Pt given urinal and asked to give urine when possible.

## 2019-08-25 NOTE — ED Notes (Signed)
Family at bedside. 

## 2019-08-25 NOTE — ED Provider Notes (Signed)
[facility] Emergency Department Provider Note   ____________________________________________   First MD Initiated Contact with Patient 08/25/19 2218     (approximate)  I have reviewed the triage vital signs and the nursing notes.   HISTORY  Chief Complaint Code STEMI    HPI Dalton Fleming is a 68 y.o. male who came via EMS.  EMS was called for syncope.  Patient woke up and had a tonic-clonic full body seizure.  He complained of some pain in his neck and shoulders but this pain has been present since he had surgery for herniated disc.  He is currently feeling okay.  He said he had some hard cider earlier today.  He said he has been out of his house in 2 years.  EMS did give him some aspirin.         Past Medical History:  Diagnosis Date  . Affective disorder ([place]) 12/12/2012  . Affective disorder, major 07/10/2013  . BPH (benign prostatic hyperplasia)   . Chronic chest wall pain 07/01/2017  . DDD (degenerative disc disease), cervical 12/12/2012  . Depression   . Hx of hepatitis C   . Impotence   . Inability to urinate   . Mononeuropathy of upper extremity 12/12/2012  . Panic disorder without agoraphobia 04/18/2013  . Peyronie's disease   . Rectal polyp 06/09/2016  . Sebaceous cyst 06/09/2016  . Urinary frequency     Patient Active Problem List   Diagnosis Date Noted  . Pure hypercholesterolemia 06/21/2018  . Chronic pain syndrome 06/20/2018  . Chronic chest wall pain 07/01/2017  . Sebaceous cyst 06/09/2016  . BPH (benign prostatic hyperplasia) 06/09/2016  . Peyronie disease 06/09/2016  . Rectal polyp 06/09/2016  . Affective disorder, major 07/10/2013  . Panic disorder without agoraphobia 04/18/2013  . DDD (degenerative disc disease), cervical 12/12/2012  . Mononeuropathy of upper extremity 12/12/2012  . Affective disorder ([place]) 12/12/2012    Past Surgical History:  Procedure Laterality Date  . APPENDECTOMY    . CERVICAL SPINE  SURGERY    . HERNIA REPAIR      Prior to Admission medications   Medication Sig Start Date End Date Taking? Authorizing Provider  ALPRAZolam Duanne Moron) 1 MG tablet Take 1 tablet (1 mg total) by mouth daily as needed for anxiety (panic). 06/20/18   Karamalegos, Devonne Doughty, DO  aspirin EC 81 MG tablet Take 1 tablet (81 mg total) by mouth daily. 04/04/17   Karamalegos, Devonne Doughty, DO  meloxicam (MOBIC) 15 MG tablet TAKE ONE TABLET BY MOUTH EVERY DAY AS NEEDED FOR PAIN 04/16/19   Olin Hauser, DO  tamsulosin (FLOMAX) 0.4 MG CAPS capsule TAKE 1 CAPSULE BY MOUTH EVERY DAY 05/20/19   Parks Ranger, Devonne Doughty, DO    Allergies Codeine  Family History  Problem Relation Age of Onset  . Heart disease Father   . Breast cancer Sister   . Leukemia Mother   . Diabetes Maternal Grandmother   . Colon cancer Paternal Grandfather   . Heart Problems Maternal Grandfather   . Prostate cancer Neg Hx     Social History Social History   Tobacco Use  . Smoking status: Current Every Day Smoker    Packs/day: 0.50    Years: 20.00    Pack years: 10.00    Last attempt to quit: 06/27/2016    Years since quitting: 3.1  . Smokeless tobacco: Current User  Substance Use Topics  . Alcohol use: Yes    Alcohol/week: 0.0 standard drinks  Comment: cup full of hard cider today  . Drug use: Yes    Types: Marijuana    Review of Systems  Constitutional: No fever/chills Eyes: No visual changes. ENT: No sore throat. Cardiovascular: Denies chest pain. Respiratory: Denies shortness of breath. Gastrointestinal: No abdominal pain.  No nausea, no vomiting.  No diarrhea.  No constipation. Genitourinary: Negative for dysuria. Musculoskeletal: Negative for back pain. Skin: Negative for rash. Neurological: Negative for headaches  ____________________________________________   PHYSICAL EXAM:  VITAL SIGNS: ED Triage Vitals [08/25/19 2217]  Enc Vitals Group     BP      Pulse      Resp      Temp       Temp src      SpO2      Weight 100 lb (45.4 kg)     Height 5\' 7"  (1.702 m)     Head Circumference      Peak Flow      Pain Score      Pain Loc      Pain Edu?      Excl. in GC?     Constitutional: Alert and oriented. Well appearing and in no acute distress. Eyes: Conjunctivae are normal.  Head: Atraumatic. Nose: No congestion/rhinnorhea. Mouth/Throat: Mucous membranes are moist.  Oropharynx non-erythematous. Neck: No stridor. Cardiovascular: Normal rate, regular rhythm. Grossly normal heart sounds.  Good peripheral circulation. Respiratory: Normal respiratory effort.  No retractions. Lungs CTAB. Gastrointestinal: Soft and nontender. No distention. No abdominal bruits. No CVA tenderness. Musculoskeletal: No lower extremity tenderness nor edema.   Neurologic:  Normal speech and language. No gross focal neurologic deficits are appreciated. Skin:  Skin is warm, dry and intact. No rash noted.   ____________________________________________   LABS (all labs ordered are listed, but only abnormal results are displayed)  Labs Reviewed  COMPREHENSIVE METABOLIC PANEL - Abnormal; Notable for the following components:      Result Value   Glucose, Bld 134 (*)    All other components within normal limits  ACETAMINOPHEN LEVEL - Abnormal; Notable for the following components:   Acetaminophen (Tylenol), Serum <10 (*)    All other components within normal limits  ETHANOL  LIPASE, BLOOD  BRAIN NATRIURETIC PEPTIDE  SALICYLATE LEVEL  CBC WITH DIFFERENTIAL/PLATELET  LACTIC ACID, PLASMA  LACTIC ACID, PLASMA  URINALYSIS, COMPLETE (UACMP) WITH MICROSCOPIC  URINE DRUG SCREEN, QUALITATIVE ([facility] ONLY)  TROPONIN I (HIGH SENSITIVITY)   ____________________________________________  EKG EKG read interpreted by me shows normal sinus rhythm rate of 71 normal axis current EKG does not show any ST segment elevation or depression.  Previous EKGs done by EMS do show some ST elevation in one of them and  V3 and V4 another 1 and lead II and leadf.  ____________________________________________  RADIOLOGY  ED MD interpretation:   Official radiology report(s): Dg Chest Portable 1 View  Result Date: 08/25/2019 CLINICAL DATA:  68 year old male with seizure and syncope. EXAM: PORTABLE CHEST 1 VIEW COMPARISON:  Chest radiograph dated 06/23/2016 FINDINGS: No focal consolidation, pleural effusion, or pneumothorax. Background of emphysema. The cardiac silhouette is within normal limits. Atherosclerotic calcification of the aorta. No acute osseous pathology. Partially visualized lower cervical ACDF. IMPRESSION: 1. No acute cardiopulmonary process. 2. Emphysema. Electronically Signed   By: Elgie CollardArash  Radparvar M.D.   On: 08/25/2019 23:01    ____________________________________________   PROCEDURES  Procedure(s) performed (including Critical Care):  Procedures   ____________________________________________   INITIAL IMPRESSION / ASSESSMENT AND PLAN / ED COURSE  Dr. Juliann Pares comes to evaluate the patient.  He does not believe that this is a STEMI.  We will evaluate the patient for his syncope and seizure and plan on getting him in the hospital unless a reason not to admit him turns up.  Dr. Wynelle Link will check on the lab work. Mylon Mabey was evaluated in Emergency Department on 08/25/2019 for the symptoms described in the history of present illness. He was evaluated in the context of the global COVID-19 pandemic, which necessitated consideration that the patient might be at risk for infection with the SARS-CoV-2 virus that causes COVID-19. Institutional protocols and algorithms that pertain to the evaluation of patients at risk for COVID-19 are in a state of rapid change based on information released by regulatory bodies including the CDC and federal and state organizations. These policies and algorithms were followed during the patient's care in the ED.           ____________________________________________   FINAL CLINICAL IMPRESSION(S) / ED DIAGNOSES  Final diagnoses:  Syncope, unspecified syncope type  Seizure [facility])     ED Discharge Orders    None       Note:  This document was prepared using Dragon voice recognition software and may include unintentional dictation errors.    Arnaldo Natal, MD 08/25/19 [phone]

## 2019-08-25 NOTE — ED Triage Notes (Signed)
Pt coming in code STEMI. Pt was in the bathroom tonight and had a syncopal episode and fell in the floor. Pt then woke up with EMS and had a seizure for 1 minute with EMS. Pt was hypotensive with EMS initial pressure of 60/30. Denies chest pain. Pt reports neck pain and shoulder pain. Pt has a hx of shoulder surgery.

## 2019-08-26 ENCOUNTER — Encounter: Payer: Self-pay | Admitting: Internal Medicine

## 2019-08-26 ENCOUNTER — Observation Stay
Admit: 2019-08-26 | Discharge: 2019-08-26 | Disposition: A | Discharge status: Home / Self Care | Payer: Medicare Other | Attending: Internal Medicine | Admitting: Internal Medicine

## 2019-08-26 DIAGNOSIS — R55 Syncope and collapse: Secondary | ICD-10-CM | POA: Diagnosis present

## 2019-08-26 DIAGNOSIS — I308 Other forms of acute pericarditis: Secondary | ICD-10-CM | POA: Diagnosis not present

## 2019-08-26 DIAGNOSIS — R079 Chest pain, unspecified: Secondary | ICD-10-CM | POA: Diagnosis not present

## 2019-08-26 DIAGNOSIS — E860 Dehydration: Secondary | ICD-10-CM | POA: Diagnosis not present

## 2019-08-26 DIAGNOSIS — I319 Disease of pericardium, unspecified: Secondary | ICD-10-CM

## 2019-08-26 DIAGNOSIS — M5030 Other cervical disc degeneration, unspecified cervical region: Secondary | ICD-10-CM

## 2019-08-26 DIAGNOSIS — N400 Enlarged prostate without lower urinary tract symptoms: Secondary | ICD-10-CM | POA: Diagnosis not present

## 2019-08-26 LAB — URINALYSIS, COMPLETE (UACMP) WITH MICROSCOPIC
Bacteria, UA: NONE SEEN
Bilirubin Urine: NEGATIVE
Glucose, UA: NEGATIVE mg/dL
Hgb urine dipstick: NEGATIVE
Ketones, ur: NEGATIVE mg/dL
Leukocytes,Ua: NEGATIVE
Nitrite: NEGATIVE
Protein, ur: 30 mg/dL — AB
Specific Gravity, Urine: 1.025 (ref 1.005–1.030)
Squamous Epithelial / HPF: NONE SEEN (ref 0–5)
pH: 6 (ref 5.0–8.0)

## 2019-08-26 LAB — COMPREHENSIVE METABOLIC PANEL
ALT: 13 U/L (ref 0–44)
AST: 18 U/L (ref 15–41)
Albumin: 3.5 g/dL (ref 3.5–5.0)
Alkaline Phosphatase: 33 U/L — ABNORMAL LOW (ref 38–126)
Anion gap: 9 (ref 5–15)
BUN: 15 mg/dL (ref 8–23)
CO2: 22 mmol/L (ref 22–32)
Calcium: 8 mg/dL — ABNORMAL LOW (ref 8.9–10.3)
Chloride: 108 mmol/L (ref 98–111)
Creatinine, Ser: 0.7 mg/dL (ref 0.61–1.24)
GFR calc Af Amer: >60 mL/min (ref >60)
GFR calc non Af Amer: >60 mL/min (ref >60)
Glucose, Bld: 142 mg/dL — ABNORMAL HIGH (ref 70–99)
Potassium: 3.8 mmol/L (ref 3.5–5.1)
Sodium: 139 mmol/L (ref 135–145)
Total Bilirubin: 0.6 mg/dL (ref 0.3–1.2)
Total Protein: 6 g/dL — ABNORMAL LOW (ref 6.5–8.1)

## 2019-08-26 LAB — URINE DRUG SCREEN, QUALITATIVE (ARMC ONLY)
Amphetamines, Ur Screen: NOT DETECTED
Barbiturates, Ur Screen: NOT DETECTED
Benzodiazepine, Ur Scrn: NOT DETECTED
Cannabinoid 50 Ng, Ur ~~LOC~~: POSITIVE — AB
Cocaine Metabolite,Ur ~~LOC~~: NOT DETECTED
MDMA (Ecstasy)Ur Screen: NOT DETECTED
Methadone Scn, Ur: NOT DETECTED
Opiate, Ur Screen: NOT DETECTED
Phencyclidine (PCP) Ur S: NOT DETECTED
Tricyclic, Ur Screen: NOT DETECTED

## 2019-08-26 LAB — CBC
HCT: 36.3 % — ABNORMAL LOW (ref 39.0–52.0)
Hemoglobin: 11.7 g/dL — ABNORMAL LOW (ref 13.0–17.0)
MCH: 26.8 pg (ref 26.0–34.0)
MCHC: 32.2 g/dL (ref 30.0–36.0)
MCV: 83.3 fL (ref 80.0–100.0)
Platelets: 214 10*3/uL (ref 150–400)
RBC: 4.36 MIL/uL (ref 4.22–5.81)
RDW: 13.5 % (ref 11.5–15.5)
WBC: 8.3 10*3/uL (ref 4.0–10.5)
nRBC: 0 % (ref 0.0–0.2)

## 2019-08-26 LAB — ECHOCARDIOGRAM COMPLETE
Height: 67 in
Weight: 1600 oz

## 2019-08-26 LAB — TROPONIN I (HIGH SENSITIVITY): Troponin I (High Sensitivity): <2 ng/L (ref <18)

## 2019-08-26 LAB — MAGNESIUM: Magnesium: 2 mg/dL (ref 1.7–2.4)

## 2019-08-26 LAB — PHOSPHORUS: Phosphorus: 2.3 mg/dL — ABNORMAL LOW (ref 2.5–4.6)

## 2019-08-26 LAB — HIV ANTIBODY (ROUTINE TESTING W REFLEX): HIV Screen 4th Generation wRfx: NONREACTIVE

## 2019-08-26 LAB — SARS CORONAVIRUS 2 (TAT 6-24 HRS): SARS Coronavirus 2: NEGATIVE

## 2019-08-26 MED ORDER — SODIUM CHLORIDE 0.9 % IV SOLN
Freq: Once | INTRAVENOUS | Status: AC
  Administered 2019-08-26: 1000 mL via INTRAVENOUS

## 2019-08-26 MED ORDER — ENOXAPARIN SODIUM 40 MG/0.4ML ~~LOC~~ SOLN
40.0000 mg | SUBCUTANEOUS | Status: DC
  Administered 2019-08-26 – 2019-08-27 (×2): 40 mg via SUBCUTANEOUS
  Filled 2019-08-26 (×3): qty 0.4

## 2019-08-26 MED ORDER — SODIUM CHLORIDE 0.9 % IV BOLUS
1000.0000 mL | Freq: Once | INTRAVENOUS | Status: AC
  Administered 2019-08-26: 1000 mL via INTRAVENOUS

## 2019-08-26 MED ORDER — NICOTINE 21 MG/24HR TD PT24
21.0000 mg | MEDICATED_PATCH | Freq: Every day | TRANSDERMAL | Status: DC
  Filled 2019-08-26 (×2): qty 1

## 2019-08-26 MED ORDER — ACETAMINOPHEN 325 MG PO TABS: 650.0000 mg | ORAL_TABLET | Freq: Four times a day (QID) | ORAL | Status: DC | PRN

## 2019-08-26 MED ORDER — MELOXICAM 7.5 MG PO TABS
15.0000 mg | ORAL_TABLET | Freq: Every day | ORAL | Status: DC | PRN
  Administered 2019-08-26: 15 mg via ORAL
  Filled 2019-08-26: qty 2

## 2019-08-26 NOTE — ED Notes (Signed)
Patient is resting comfortably. 

## 2019-08-26 NOTE — Progress Notes (Addendum)
68 year old gentleman with past medical history significant for chronic neck pain and BPH presented to ED after having a syncopal episode and postural dizziness.  Cardiology was consulted and an echo was ordered-echo is normal with normal ejection fraction and no sign of pericardial effusion or inflammation. Chest x-ray and CT head was normal.  Patient was feeling better and denies any more dizziness when seen during rounding today.  He wants to go home. His symptoms are more consistent with orthostatic.  Explained dizziness as being lightheaded whenever he get up from sitting or lying down position. Patient seems little dehydrated and was not drinking well.  -We will check orthostatic vitals. -Continue home meds. -We will get PT/OT evaluation. -Will be discharged tomorrow.

## 2019-08-26 NOTE — ED Notes (Signed)
Pt given a tray of food to eat. 

## 2019-08-26 NOTE — H&P (Signed)
History and Physical  Dalton HolidayJames Marion Dacanay WUJ:[id number] DOB: 04/30/1951 DOA: 08/25/2019  Referring physician: Darnelle CatalanMalinda MD PCP: Smitty CordsKaramalegos, Alexander J, DO  Patient coming from: Home  Chief Complaint: Syncope  HPI: Dalton Fleming is a 68 y.o. male with medical history significant for Chronic neck pain and BPH who presents to the ED via EMS due to syncopal episode sustained at home. Patient said that he felt a little dizzy in his room, but this appeared to clear after resting a little, he then went to the bathroom and while sitting on a bathroom chair, he felt so weak and thought that he could have passed out. One of his sons checked on him while in the bathroom and said that it appeared that he was having seizures. Patient denied any awareness of a seizure event and said that he didnt bite his tongue, nor had any bowel/bladder incontinence. EMS was called and on arrival of EMS, patient said that he was aware of what was happening around him, though he was weak and sluggish to answer questions. He denied fever, chest pain, SOB, N/V, diarrhea or abdominal pain.  ED Course: In the ED, temp was 97.39F, BP 145/96 and other vital signs were normal. Work up in the ED showed normal CBC and BMP except for hyperglycemia, High sensitivity Troponin x 1 was 2. Tylenol and alcohol levels were normal. CT head/cervical spine without contrast showed no acute intracranial hemorrhage or acute/traumatic cervical spine pathology. Chest X ray showed no acute intracranial pathology Cardiologist was consulted due to suspicion for STEMI, but Cardiologist ruled this out and thought this was due  possible inflammation/pericarditis. Hospitalist was asked to admit patient for further evaluation and management.  Review of Systems: Review of Systems  Constitutional: Negative for chills and fever.  HENT: Negative for ear pain and sore throat.   Eyes: Negative for pain and visual disturbance.  Respiratory: Negative for  cough, chest tightness and shortness of breath.   Cardiovascular: Negative for chest pain and palpitations.  Gastrointestinal: Negative for abdominal pain and vomiting.  Endocrine: Negative for polyphagia and polyuria.  Genitourinary: Negative for decreased urine volume, dysuria Musculoskeletal: Chronic neck pain.  Negative for arthralgias and back pain.  Skin: Negative for color change and rash.  Allergic/Immunologic: Negative for immunocompromised state.  Neurological: Negative for tremors, syncope, speech difficulty, weakness, light-headedness and headaches.  Hematological: Does not bruise/bleed easily.  All other systems reviewed and are negative    Past Medical History:  Diagnosis Date   Affective disorder ([facility]) 12/12/2012   Affective disorder, major 07/10/2013   BPH (benign prostatic hyperplasia)    Chronic chest wall pain 07/01/2017   DDD (degenerative disc disease), cervical 12/12/2012   Depression    Hx of hepatitis C    Impotence    Inability to urinate    Mononeuropathy of upper extremity 12/12/2012   Panic disorder without agoraphobia 04/18/2013   Peyronie's disease    Rectal polyp 06/09/2016   Sebaceous cyst 06/09/2016   Urinary frequency    Past Surgical History:  Procedure Laterality Date   APPENDECTOMY     CERVICAL SPINE SURGERY     HERNIA REPAIR      Social History:  reports that he has been smoking. He has a 10.00 pack-year smoking history. He uses smokeless tobacco. He reports current alcohol use. He reports current drug use. Drug: Marijuana.   Allergies  Allergen Reactions   Codeine Rash    Other reaction(s): Vomiting    Family History  Problem  Relation Age of Onset   Heart disease Father    Breast cancer Sister    Leukemia Mother    Diabetes Maternal Grandmother    Colon cancer Paternal Grandfather    Heart Problems Maternal Grandfather    Prostate cancer Neg Hx      Prior to Admission medications   Medication Sig Start  Date End Date Taking? Authorizing Provider  ALPRAZolam Prudy Feeler) 1 MG tablet Take 1 tablet (1 mg total) by mouth daily as needed for anxiety (panic). 06/20/18   Karamalegos, Netta Neat, DO  aspirin EC 81 MG tablet Take 1 tablet (81 mg total) by mouth daily. 04/04/17   Karamalegos, Netta Neat, DO  meloxicam (MOBIC) 15 MG tablet TAKE ONE TABLET BY MOUTH EVERY DAY AS NEEDED FOR PAIN 04/16/19   Smitty Cords, DO  tamsulosin (FLOMAX) 0.4 MG CAPS capsule TAKE 1 CAPSULE BY MOUTH EVERY DAY 05/20/19   Althea Charon Netta Neat, DO    Physical Exam: BP 132/77    Pulse 90    Temp (!) 97.5 F (36.4 C) (Oral)    Resp 16    Ht  (1.702 m)    Wt 45.4 kg    SpO2 99%    BMI 15.66 kg/m    General: 68 y.o. year-old male cachetic, in no acute distress.  Alert and oriented x3.  HEENT: Dry mucous membrane.  Normocephalic atraumatic, pupils equal reactive to light and accommodation  NECK: Supple, trachea midline, normal range of motion  Cardiovascular: Regular rate and rhythm with no rubs or gallops.  No thyromegaly or JVD noted.  No lower extremity edema. 2/4 pulses in all 4 extremities.  Respiratory: Clear to auscultation with no wheezes or rales. Good inspiratory effort.  Abdomen: Soft nontender nondistended with normal bowel sounds x4 quadrants.  Muskuloskeletal: No cyanosis, clubbing or edema noted bilaterally  Neuro: CN II-XII intact, strength, sensation, reflexes  Skin: No ulcerative lesions noted or rashes  Psychiatry: Judgement and insight appear normal. Mood is appropriate for condition and setting          Labs on Admission:  Basic Metabolic Panel: Recent Labs  Lab 08/25/19 2221  NA 140  K 4.4  CL 102  CO2 26  GLUCOSE 134*  BUN 17  CREATININE 1.03  CALCIUM 9.0   Liver Function Tests: Recent Labs  Lab 08/25/19 2221  AST 16  ALT 11  ALKPHOS 41  BILITOT 0.9  PROT 7.6  ALBUMIN 4.5   Recent Labs  Lab 08/25/19 2221  LIPASE 22   No results for input(s): AMMONIA  in the last 168 hours. CBC: Recent Labs  Lab 08/25/19 2221  WBC 7.2  NEUTROABS 3.9  HGB 15.0  HCT 46.5  MCV 83.3  PLT 261   Cardiac Enzymes: No results for input(s): CKTOTAL, CKMB, CKMBINDEX, TROPONINI in the last 168 hours.  BNP (last 3 results) Recent Labs    08/25/19 2221  BNP 10.0    ProBNP (last 3 results) No results for input(s): PROBNP in the last 8760 hours.  CBG: No results for input(s): GLUCAP in the last 168 hours.  Radiological Exams on Admission: Ct Head Wo Contrast  Result Date: 08/26/2019 CLINICAL DATA:  68 year old male with seizure. EXAM: CT HEAD WITHOUT CONTRAST CT CERVICAL SPINE WITHOUT CONTRAST TECHNIQUE: Multidetector CT imaging of the head and cervical spine was performed following the standard protocol without intravenous contrast. Multiplanar CT image reconstructions of the cervical spine were also generated. COMPARISON:  CT dated 09/03/2012 FINDINGS: CT HEAD  FINDINGS Brain: Mild age-related atrophy and chronic microvascular ischemic changes. There is no acute intracranial hemorrhage. No mass effect or midline shift. No extra-axial fluid collection. Vascular: No hyperdense vessel or unexpected calcification. Skull: Normal. Negative for fracture or focal lesion. Sinuses/Orbits: No acute finding. Other: None CT CERVICAL SPINE FINDINGS Alignment: No acute subluxation. Skull base and vertebrae: No acute fracture. Old-appearing fracture of C3 with approximately 40% central depression. C5-C7 bone graft and anterior fixation plate and screws. Mild age indeterminate, likely old compression fracture of superior endplate of T1. Soft tissues and spinal canal: No acute findings. No canal hematoma. Disc levels: Multilevel degenerative changes with C5-C7 fusion. Narrowing of the C3-C4 neural foramina bilaterally. Upper chest: Emphysema with biapical subpleural scarring. Other: None IMPRESSION: 1. No acute intracranial hemorrhage. Mild age-related atrophy and chronic  microvascular ischemic changes. 2. No acute/traumatic cervical spine pathology. Old-appearing fracture of C3 with approximately 40% central depression. 3. C5-C7 fusion. Electronically Signed   By: Anner Crete M.D.   On: 08/26/2019 00:12   Ct Cervical Spine Wo Contrast  Result Date: 08/26/2019 CLINICAL DATA:  68 year old male with seizure. EXAM: CT HEAD WITHOUT CONTRAST CT CERVICAL SPINE WITHOUT CONTRAST TECHNIQUE: Multidetector CT imaging of the head and cervical spine was performed following the standard protocol without intravenous contrast. Multiplanar CT image reconstructions of the cervical spine were also generated. COMPARISON:  CT dated 09/03/2012 FINDINGS: CT HEAD FINDINGS Brain: Mild age-related atrophy and chronic microvascular ischemic changes. There is no acute intracranial hemorrhage. No mass effect or midline shift. No extra-axial fluid collection. Vascular: No hyperdense vessel or unexpected calcification. Skull: Normal. Negative for fracture or focal lesion. Sinuses/Orbits: No acute finding. Other: None CT CERVICAL SPINE FINDINGS Alignment: No acute subluxation. Skull base and vertebrae: No acute fracture. Old-appearing fracture of C3 with approximately 40% central depression. C5-C7 bone graft and anterior fixation plate and screws. Mild age indeterminate, likely old compression fracture of superior endplate of T1. Soft tissues and spinal canal: No acute findings. No canal hematoma. Disc levels: Multilevel degenerative changes with C5-C7 fusion. Narrowing of the C3-C4 neural foramina bilaterally. Upper chest: Emphysema with biapical subpleural scarring. Other: None IMPRESSION: 1. No acute intracranial hemorrhage. Mild age-related atrophy and chronic microvascular ischemic changes. 2. No acute/traumatic cervical spine pathology. Old-appearing fracture of C3 with approximately 40% central depression. 3. C5-C7 fusion. Electronically Signed   By: Anner Crete M.D.   On: 08/26/2019 00:12    Dg Chest Portable 1 View  Result Date: 08/25/2019 CLINICAL DATA:  68 year old male with seizure and syncope. EXAM: PORTABLE CHEST 1 VIEW COMPARISON:  Chest radiograph dated 06/23/2016 FINDINGS: No focal consolidation, pleural effusion, or pneumothorax. Background of emphysema. The cardiac silhouette is within normal limits. Atherosclerotic calcification of the aorta. No acute osseous pathology. Partially visualized lower cervical ACDF. IMPRESSION: 1. No acute cardiopulmonary process. 2. Emphysema. Electronically Signed   By: Anner Crete M.D.   On: 08/25/2019 23:01    EKG: I independently viewed the EKG done and my findings are as followed: Sinus rhythm at rate of 71bpm  Assessment/Plan Present on Admission:  Syncope  DDD (degenerative disc disease), cervical  BPH (benign prostatic hyperplasia)  Principal Problem:   Syncope Active Problems:   DDD (degenerative disc disease), cervical   BPH (benign prostatic hyperplasia)   Pericarditis   Dehydration  Syncope r/o Seizures Possible pericarditis Patient will be admitted to telemetry unit Cardiologist (Dr. Lujean Amel) was consulted and presumed this is possibly due to endocarditis; echocardiogram was recommended Orthostatic will be done  Continue Mobic per home regimen Son said that patient appeared to have seizures at home, EEG will be done to rule out seizures  Dehydration Continue IV hydration  Failure to thrive in adult Dietary will be consulted  Hyperglycemia possibly reactive process Blood glucose level at 134; continue to monitor blood glucose with morning labs  BPH Continue Flomax when med rec is updated  Degenerative disc disease (cervical) Continue Mobic per home regiment  History of tobacco abuse/substance abuse Patient counseled on tobacco/substance abuse cessation and he verbalized understanding discussion. Continue nicotine patch  History of alcohol use Alcohol level was negative Continue to  monitor for any alcohol withdrawal sign and treat accordingly  DVT prophylaxis:   Code Status: Full  Family Communication: Son at bedside  Disposition Plan: Home once clinically stable  Consults called: Cardiologist(Callwood Dwayne)  Admission status: Observation    Frankey Shown MD [facility]  If 7PM-7AM, please contact night-coverage [email or web address]  08/26/2019, 2:49 AM

## 2019-08-26 NOTE — Progress Notes (Signed)
*  PRELIMINARY RESULTS* Echocardiogram 2D Echocardiogram has been performed.  Sherrie Sport 08/26/2019, 2:43 PM

## 2019-08-26 NOTE — ED Notes (Signed)
Family at bedside. 

## 2019-08-26 NOTE — ED Notes (Signed)
Pt oob with standby assist

## 2019-08-26 NOTE — ED Notes (Signed)
Answered pt's call bell; given remote as requested; no other requests or complaints

## 2019-08-27 ENCOUNTER — Other Ambulatory Visit: Payer: Medicare Other

## 2019-08-27 DIAGNOSIS — E860 Dehydration: Secondary | ICD-10-CM | POA: Diagnosis not present

## 2019-08-27 DIAGNOSIS — R55 Syncope and collapse: Secondary | ICD-10-CM | POA: Diagnosis not present

## 2019-08-27 DIAGNOSIS — N400 Enlarged prostate without lower urinary tract symptoms: Secondary | ICD-10-CM

## 2019-08-27 NOTE — Progress Notes (Signed)
eeg completed ° °

## 2019-08-27 NOTE — Procedures (Signed)
Patient Name: Fay Swider  MRN: [id number]  Epilepsy Attending: Lora Havens  Referring Physician/Provider: Dr Bernadette Hoit Date: 08/27/2019 Duration: 25.44 minutes  Patient history: 68 year old male with syncope.  EEG to evaluate for seizures.  Level of alertness: Awake, sleep  AEDs during EEG study: None  Technical aspects: This EEG study was done with scalp electrodes positioned according to the 10-20 International system of electrode placement. Electrical activity was acquired at a sampling rate of 500Hz  and reviewed with a high frequency filter of 70Hz  and a low frequency filter of 1Hz . EEG data were recorded continuously and digitally stored.   Description: During awake state, the posterior dominant rhythm consists of 9-10 Hz activity of moderate voltage (25-35 uV) seen predominantly in posterior head regions, symmetric and reactive to eye opening and eye closing.  Sleep was characterized by vertex waves, sleep spindles (12 to 14 Hz), maximal frontocentral.  Physiologic photic driving was seen during photic stimulation.  Hyperventilation was not performed.          IMPRESSION: This study is within normal limits. No seizures or epileptiform discharges were seen throughout the recording.  Raiven Belizaire Barbra Sarks

## 2019-08-27 NOTE — Discharge Summary (Signed)
Physician Discharge Summary  Dalton HolidayJames Marion Gironda ZOX:[id number] DOB: 12/02/1950 DOA: 08/25/2019  PCP: Smitty CordsKaramalegos, Alexander J, DO  Admit date: 08/25/2019 Discharge date: 08/27/2019  Admitted From: Home Disposition: Home  Recommendations for Outpatient Follow-up:  1. Follow up with PCP in 1-2 weeks 2. Please obtain BMP/CBC in one week 3. Please follow up on the following pending results:  Home Health: No Equipment/Devices: None Discharge Condition: Stable CODE STATUS: Full Diet recommendation: Heart Healthy / Carb Modified   Brief/Interim Summary: 68 year old gentleman with past medical history significant for chronic neck pain and BPH presented to ED after having a syncopal episode and postural dizziness.  Cardiology was consulted and an echo was performed which was normal.  There was some concern for seizure-like activity which was most likely due to hypoxia.  EEG was done and was normal. He was found to have positive orthostatic vitals which was thought to be due to decreased p.o. intake.  He was advised to keep himself well-hydrated and move slowly.  He will follow-up with his PCP.  Discharge Diagnoses:  Principal Problem:   Syncope Active Problems:   DDD (degenerative disc disease), cervical   BPH (benign prostatic hyperplasia)   Chronic pain syndrome   Pericarditis   Dehydration  Discharge Instructions  Discharge Instructions    Call MD for:  persistant dizziness or light-headedness   Complete by: As directed    Discharge instructions   Complete by: As directed    It was pleasure taking care of you. As we discussed you drop your blood pressure when you stand up from sitting or lying down position.  In order to avoid any falls you have to keep yourself well hydrated and moves slowly. Please follow-up with your PCP within 1 to 2 weeks.   Increase activity slowly   Complete by: As directed      Allergies as of 08/27/2019      Reactions   Codeine Rash   Other  reaction(s): Vomiting      Medication List    STOP taking these medications   ALPRAZolam 1 MG tablet Commonly known as: XANAX     TAKE these medications   aspirin EC 81 MG tablet Take 1 tablet (81 mg total) by mouth daily.   meloxicam 15 MG tablet Commonly known as: MOBIC TAKE ONE TABLET BY MOUTH EVERY DAY AS NEEDED FOR PAIN What changed: See the new instructions.   tamsulosin 0.4 MG Caps capsule Commonly known as: FLOMAX TAKE 1 CAPSULE BY MOUTH EVERY DAY       Allergies  Allergen Reactions  . Codeine Rash    Other reaction(s): Vomiting    Consultations: Cardiology.  Procedures/Studies: Ct Head Wo Contrast  Result Date: 08/26/2019 CLINICAL DATA:  68 year old male with seizure. EXAM: CT HEAD WITHOUT CONTRAST CT CERVICAL SPINE WITHOUT CONTRAST TECHNIQUE: Multidetector CT imaging of the head and cervical spine was performed following the standard protocol without intravenous contrast. Multiplanar CT image reconstructions of the cervical spine were also generated. COMPARISON:  CT dated 09/03/2012 FINDINGS: CT HEAD FINDINGS Brain: Mild age-related atrophy and chronic microvascular ischemic changes. There is no acute intracranial hemorrhage. No mass effect or midline shift. No extra-axial fluid collection. Vascular: No hyperdense vessel or unexpected calcification. Skull: Normal. Negative for fracture or focal lesion. Sinuses/Orbits: No acute finding. Other: None CT CERVICAL SPINE FINDINGS Alignment: No acute subluxation. Skull base and vertebrae: No acute fracture. Old-appearing fracture of C3 with approximately 40% central depression. C5-C7 bone graft and anterior fixation plate and screws.  Mild age indeterminate, likely old compression fracture of superior endplate of T1. Soft tissues and spinal canal: No acute findings. No canal hematoma. Disc levels: Multilevel degenerative changes with C5-C7 fusion. Narrowing of the C3-C4 neural foramina bilaterally. Upper chest: Emphysema with  biapical subpleural scarring. Other: None IMPRESSION: 1. No acute intracranial hemorrhage. Mild age-related atrophy and chronic microvascular ischemic changes. 2. No acute/traumatic cervical spine pathology. Old-appearing fracture of C3 with approximately 40% central depression. 3. C5-C7 fusion. Electronically Signed   By: Elgie Collard M.D.   On: 08/26/2019 00:12   Ct Cervical Spine Wo Contrast  Result Date: 08/26/2019 CLINICAL DATA:  68 year old male with seizure. EXAM: CT HEAD WITHOUT CONTRAST CT CERVICAL SPINE WITHOUT CONTRAST TECHNIQUE: Multidetector CT imaging of the head and cervical spine was performed following the standard protocol without intravenous contrast. Multiplanar CT image reconstructions of the cervical spine were also generated. COMPARISON:  CT dated 09/03/2012 FINDINGS: CT HEAD FINDINGS Brain: Mild age-related atrophy and chronic microvascular ischemic changes. There is no acute intracranial hemorrhage. No mass effect or midline shift. No extra-axial fluid collection. Vascular: No hyperdense vessel or unexpected calcification. Skull: Normal. Negative for fracture or focal lesion. Sinuses/Orbits: No acute finding. Other: None CT CERVICAL SPINE FINDINGS Alignment: No acute subluxation. Skull base and vertebrae: No acute fracture. Old-appearing fracture of C3 with approximately 40% central depression. C5-C7 bone graft and anterior fixation plate and screws. Mild age indeterminate, likely old compression fracture of superior endplate of T1. Soft tissues and spinal canal: No acute findings. No canal hematoma. Disc levels: Multilevel degenerative changes with C5-C7 fusion. Narrowing of the C3-C4 neural foramina bilaterally. Upper chest: Emphysema with biapical subpleural scarring. Other: None IMPRESSION: 1. No acute intracranial hemorrhage. Mild age-related atrophy and chronic microvascular ischemic changes. 2. No acute/traumatic cervical spine pathology. Old-appearing fracture of C3 with  approximately 40% central depression. 3. C5-C7 fusion. Electronically Signed   By: Elgie Collard M.D.   On: 08/26/2019 00:12   Dg Chest Portable 1 View  Result Date: 08/25/2019 CLINICAL DATA:  68 year old male with seizure and syncope. EXAM: PORTABLE CHEST 1 VIEW COMPARISON:  Chest radiograph dated 06/23/2016 FINDINGS: No focal consolidation, pleural effusion, or pneumothorax. Background of emphysema. The cardiac silhouette is within normal limits. Atherosclerotic calcification of the aorta. No acute osseous pathology. Partially visualized lower cervical ACDF. IMPRESSION: 1. No acute cardiopulmonary process. 2. Emphysema. Electronically Signed   By: Elgie Collard M.D.   On: 08/25/2019 23:01   Subjective: Patient was feeling better on the day of discharge and has no new complaints.  Discharge Exam: Vitals:   08/27/19 0803 08/27/19 0804  BP: 136/89 117/81  Pulse: 87 92  Resp:    Temp:    SpO2:     Vitals:   08/27/19 0539 08/27/19 0750 08/27/19 0803 08/27/19 0804  BP: 121/77 (!) 145/81 136/89 117/81  Pulse: 68 73 87 92  Resp: 18 19    Temp: 98.5 F (36.9 C) 98.1 F (36.7 C)    TempSrc: Oral Oral    SpO2: 99% 99%    Weight: 47.4 kg     Height:        General: Pt is alert, awake, not in acute distress Cardiovascular: RRR, S1/S2 +, no rubs, no gallops Respiratory: CTA bilaterally, no wheezing, no rhonchi Abdominal: Soft, NT, ND, bowel sounds + Extremities: no edema, no cyanosis   The results of significant diagnostics from this hospitalization (including imaging, microbiology, ancillary and laboratory) are listed below for reference.     Microbiology:  Recent Results (from the past 240 hour(s))  SARS CORONAVIRUS 2 (TAT 6-24 HRS) Nasopharyngeal Nasopharyngeal Swab     Status: None   Collection Time: 08/25/19 10:21 PM   Specimen: Nasopharyngeal Swab  Result Value Ref Range Status   SARS Coronavirus 2 NEGATIVE NEGATIVE Final    Comment: (NOTE) SARS-CoV-2 target nucleic  acids are NOT DETECTED. The SARS-CoV-2 RNA is generally detectable in upper and lower respiratory specimens during the acute phase of infection. Negative results do not preclude SARS-CoV-2 infection, do not rule out co-infections with other pathogens, and should not be used as the sole basis for treatment or other patient management decisions. Negative results must be combined with clinical observations, patient history, and epidemiological information. The expected result is Negative. Fact Sheet for Patients: HairSlick.no Fact Sheet for Healthcare Providers: quierodirigir.com This test is not yet approved or cleared by the [place] FDA and  has been authorized for detection and/or diagnosis of SARS-CoV-2 by FDA under an Emergency Use Authorization (EUA). This EUA will remain  in effect (meaning this test can be used) for the duration of the COVID-19 declaration under Section 56 4(b)(1) of the Act, 21 U.S.C. section 360bbb-3(b)(1), unless the authorization is terminated or revoked sooner. Performed at [facility], [address], [place], [place] [address]      Labs: BNP (last 3 results) Recent Labs    08/25/19 2221  BNP 10.0   Basic Metabolic Panel: Recent Labs  Lab 08/25/19 2221 08/26/19 0901  NA 140 139  K 4.4 3.8  CL 102 108  CO2 26 22  GLUCOSE 134* 142*  BUN 17 15  CREATININE 1.03 0.70  CALCIUM 9.0 8.0*  MG  --  2.0  PHOS  --  2.3*   Liver Function Tests: Recent Labs  Lab 08/25/19 2221 08/26/19 0901  AST 16 18  ALT 11 13  ALKPHOS 41 33*  BILITOT 0.9 0.6  PROT 7.6 6.0*  ALBUMIN 4.5 3.5   Recent Labs  Lab 08/25/19 2221  LIPASE 22   No results for input(s): AMMONIA in the last 168 hours. CBC: Recent Labs  Lab 08/25/19 2221 08/26/19 0901  WBC 7.2 8.3  NEUTROABS 3.9  --   HGB 15.0 11.7*  HCT 46.5 36.3*  MCV 83.3 83.3  PLT 261 214   Cardiac Enzymes: No results for input(s):  CKTOTAL, CKMB, CKMBINDEX, TROPONINI in the last 168 hours. BNP: Invalid input(s): POCBNP CBG: No results for input(s): GLUCAP in the last 168 hours. D-Dimer No results for input(s): DDIMER in the last 72 hours. Hgb A1c No results for input(s): HGBA1C in the last 72 hours. Lipid Profile No results for input(s): CHOL, HDL, LDLCALC, TRIG, CHOLHDL, LDLDIRECT in the last 72 hours. Thyroid function studies No results for input(s): TSH, T4TOTAL, T3FREE, THYROIDAB in the last 72 hours.  Invalid input(s): FREET3 Anemia work up No results for input(s): VITAMINB12, FOLATE, FERRITIN, TIBC, IRON, RETICCTPCT in the last 72 hours. Urinalysis    Component Value Date/Time   COLORURINE YELLOW (A) 08/26/2019 0140   APPEARANCEUR CLEAR (A) 08/26/2019 0140   LABSPEC 1.025 08/26/2019 0140   PHURINE 6.0 08/26/2019 0140   GLUCOSEU NEGATIVE 08/26/2019 0140   HGBUR NEGATIVE 08/26/2019 0140   BILIRUBINUR NEGATIVE 08/26/2019 0140   KETONESUR NEGATIVE 08/26/2019 0140   PROTEINUR 30 (A) 08/26/2019 0140   NITRITE NEGATIVE 08/26/2019 0140   LEUKOCYTESUR NEGATIVE 08/26/2019 0140   Sepsis Labs Invalid input(s): PROCALCITONIN,  WBC,  LACTICIDVEN Microbiology Recent Results (from the past  240 hour(s))  SARS CORONAVIRUS 2 (TAT 6-24 HRS) Nasopharyngeal Nasopharyngeal Swab     Status: None   Collection Time: 08/25/19 10:21 PM   Specimen: Nasopharyngeal Swab  Result Value Ref Range Status   SARS Coronavirus 2 NEGATIVE NEGATIVE Final    Comment: (NOTE) SARS-CoV-2 target nucleic acids are NOT DETECTED. The SARS-CoV-2 RNA is generally detectable in upper and lower respiratory specimens during the acute phase of infection. Negative results do not preclude SARS-CoV-2 infection, do not rule out co-infections with other pathogens, and should not be used as the sole basis for treatment or other patient management decisions. Negative results must be combined with clinical observations, patient history, and  epidemiological information. The expected result is Negative. Fact Sheet for Patients: SugarRoll.be Fact Sheet for Healthcare Providers: https://www.woods-mathews.com/ This test is not yet approved or cleared by the [place] FDA and  has been authorized for detection and/or diagnosis of SARS-CoV-2 by FDA under an Emergency Use Authorization (EUA). This EUA will remain  in effect (meaning this test can be used) for the duration of the COVID-19 declaration under Section 56 4(b)(1) of the Act, 21 U.S.C. section 360bbb-3(b)(1), unless the authorization is terminated or revoked sooner. Performed at [facility], [place] [address], [place], [place] [address]      Time coordinating discharge: Over 30 minutes  SIGNED:   Lorella Nimrod, MD  [facility] 08/27/2019, 7:39 PM Pager [phone]  If 7PM-7AM, please contact night-coverage [email or web address] Password [email or web address]

## 2019-08-27 NOTE — Plan of Care (Signed)
  Problem: Clinical Measurements: Goal: Respiratory complications will improve Outcome: Adequate for Discharge Goal: Cardiovascular complication will be avoided Outcome: Adequate for Discharge   Problem: Nutrition: Goal: Adequate nutrition will be maintained Outcome: Adequate for Discharge   Problem: Coping: Goal: Level of anxiety will decrease Outcome: Adequate for Discharge   Problem: Elimination: Goal: Will not experience complications related to urinary retention Outcome: Progressing   Problem: Elimination: Goal: Will not experience complications related to urinary retention Outcome: Progressing   Problem: Pain Managment: Goal: General experience of comfort will improve Outcome: Progressing   Problem: Safety: Goal: Ability to remain free from injury will improve Outcome: Progressing

## 2019-08-27 NOTE — Care Management Obs Status (Signed)
[place] NOTIFICATION   Patient Details  Name: Dalton Fleming MRN: [id number] Date of Birth: [birth year]   Medicare Observation Status Notification Given:  Yes    Candie Chroman, LCSW 08/27/2019, 9:51 AM

## 2019-08-27 NOTE — Evaluation (Signed)
Physical Therapy Evaluation Patient Details Name: Dalton Fleming MRN: [id number] DOB: 23-Mar-1951 Today's Date: 08/27/2019   History of Present Illness  Pt is a 68 y.o. male presenting to hospital 08/25/19 with syncope and tonic-clonic full body seizure.  Pt admitted with syncope, possible pericarditis, FTT, and dehydration.  PMH includes h/o neck and shoulder pain, c-spine surgery, h/o Hep C, panic disorder, and mononueropathy of UE.  Clinical Impression  Prior to hospital admission, pt was independent.  Pt lives with family in 1 level home with 4 steps to enter with B railings.  Currently pt is modified independent semi-supine to sit; independent with transfers and ambulation around nursing loop (no AD); and modified independent navigating 6 steps with railing.  No loss of balance noted during session's activities.  HR and O2 sats WFL during session's activities.  No acute PT needs identified; will sign off.    Follow Up Recommendations No PT follow up    Equipment Recommendations  None recommended by PT    Recommendations for Other Services       Precautions / Restrictions Precautions Precautions: Fall Restrictions Weight Bearing Restrictions: No      Mobility  Bed Mobility Overal bed mobility: Modified Independent             General bed mobility comments: Semi-supine to sit without any noted difficulties  Transfers Overall transfer level: Independent Equipment used: None             General transfer comment: steady safe transfers noted  Ambulation/Gait Ambulation/Gait assistance: Independent Gait Distance (Feet): 250 Feet Assistive device: None Gait Pattern/deviations: WFL(Within Functional Limits)     General Gait Details: steady ambulation  Stairs Stairs: Yes Stairs assistance: Modified independent (Device/Increase time) Stair Management: One rail Right;Alternating pattern;Forwards Number of Stairs: 6 General stair comments: steady safe stairs  negotiation noted  Wheelchair Mobility    Modified Rankin (Stroke Patients Only)       Balance Overall balance assessment: Independent(No loss of balance with ambulation)                                           Pertinent Vitals/Pain Pain Assessment: 0-10 Pain Score: 6  Pain Location: chronic neck and shoulder pain Pain Descriptors / Indicators: Constant Pain Intervention(s): Limited activity within patient's tolerance;Monitored during session;Other (comment)(RN notified)    Home Living Family/patient expects to be discharged to:: Private residence Living Arrangements: Children;Other relatives(Pt's 2 sons and son's wife) Available Help at Discharge: Family Type of Home: House Home Access: Stairs to enter Entrance Stairs-Rails: Doctor, general practice of Steps: 4 Home Layout: One level Home Equipment: Grab bars - tub/shower;Grab bars - toilet;Shower seat      Prior Function Level of Independence: Independent         Comments: Pt reports no h/o recent falls     Hand Dominance        Extremity/Trunk Assessment   Upper Extremity Assessment Upper Extremity Assessment: Generalized weakness    Lower Extremity Assessment Lower Extremity Assessment: Generalized weakness    Cervical / Trunk Assessment Cervical / Trunk Assessment: Normal  Communication   Communication: No difficulties  Cognition Arousal/Alertness: Awake/alert Behavior During Therapy: WFL for tasks assessed/performed Overall Cognitive Status: Within Functional Limits for tasks assessed  General Comments   Nursing cleared pt for participation in physical therapy.  Pt agreeable to PT session.    Exercises     Assessment/Plan    PT Assessment Patent does not need any further PT services  PT Problem List         PT Treatment Interventions      PT Goals (Current goals can be found in the Care Plan  section)  Acute Rehab PT Goals Patient Stated Goal: to go home PT Goal Formulation: With patient Time For Goal Achievement: 09/10/19 Potential to Achieve Goals: Good    Frequency     Barriers to discharge        Co-evaluation               AM-PAC PT "6 Clicks" Mobility  Outcome Measure Help needed turning from your back to your side while in a flat bed without using bedrails?: None Help needed moving from lying on your back to sitting on the side of a flat bed without using bedrails?: None Help needed moving to and from a bed to a chair (including a wheelchair)?: None Help needed standing up from a chair using your arms (e.g., wheelchair or bedside chair)?: None Help needed to walk in hospital room?: None Help needed climbing 3-5 steps with a railing? : None 6 Click Score: 24    End of Session Equipment Utilized During Treatment: Gait belt Activity Tolerance: Patient tolerated treatment well Patient left: in chair;with call bell/phone within reach;with chair alarm set;with nursing/sitter in room Nurse Communication: Mobility status;Precautions PT Visit Diagnosis: Muscle weakness (generalized) (M62.81)    Time: 6468-0321 PT Time Calculation (min) (ACUTE ONLY): 22 min   Charges:   PT Evaluation $PT Eval Low Complexity: 1 Low          Christos Mixson, PT 08/27/19, 10:47 AM [phone]

## 2019-09-05 ENCOUNTER — Telehealth: Payer: Self-pay | Admitting: Family Medicine

## 2019-09-05 NOTE — Telephone Encounter (Signed)
I called the patient to schedule AWV with Tiffany, but there was no answer and no option to leave a message. VDM (DD) 

## 2019-09-15 ENCOUNTER — Other Ambulatory Visit: Payer: Self-pay | Admitting: Family Medicine

## 2019-09-15 DIAGNOSIS — N401 Enlarged prostate with lower urinary tract symptoms: Secondary | ICD-10-CM

## 2019-12-09 ENCOUNTER — Other Ambulatory Visit: Payer: Self-pay | Admitting: Family Medicine

## 2019-12-09 DIAGNOSIS — N401 Enlarged prostate with lower urinary tract symptoms: Secondary | ICD-10-CM

## 2019-12-11 ENCOUNTER — Ambulatory Visit (INDEPENDENT_AMBULATORY_CARE_PROVIDER_SITE_OTHER): Payer: Medicare PPO

## 2019-12-11 VITALS — Wt 105.0 lb

## 2019-12-11 DIAGNOSIS — Z0000 Encounter for general adult medical examination without abnormal findings: Secondary | ICD-10-CM

## 2019-12-11 NOTE — Patient Instructions (Signed)
[name] , Thank you for taking time to come for your Medicare Wellness Visit. I appreciate your ongoing commitment to your health goals. Please review the following plan we discussed and let me know if I can assist you in the future.   Screening recommendations/referrals: Colonoscopy: completed 2015 Recommended yearly ophthalmology/optometry visit for glaucoma screening and checkup Recommended yearly dental visit for hygiene and checkup  Vaccinations: Influenza vaccine: declined  Pneumococcal vaccine: booster due  Tdap vaccine: due now Shingles vaccine: shingrix eligible    Advanced directives: please pick up a copy of this information next time you are in the office  Conditions/risks identified: none   Next appointment: follow up in one year for your annual wellness visit   Preventive Care 65 Years and Older, Male Preventive care refers to lifestyle choices and visits with your health care provider that can promote health and wellness. What does preventive care include?  A yearly physical exam. This is also called an annual well check.  Dental exams once or twice a year.  Routine eye exams. Ask your health care provider how often you should have your eyes checked.  Personal lifestyle choices, including:  Daily care of your teeth and gums.  Regular physical activity.  Eating a healthy diet.  Avoiding tobacco and drug use.  Limiting alcohol use.  Practicing safe sex.  Taking low doses of aspirin every day.  Taking vitamin and mineral supplements as recommended by your health care provider. What happens during an annual well check? The services and screenings done by your health care provider during your annual well check will depend on your age, overall health, lifestyle risk factors, and family history of disease. Counseling  Your health care provider may ask you questions about your:  Alcohol use.  Tobacco use.  Drug use.  Emotional well-being.  Home  and relationship well-being.  Sexual activity.  Eating habits.  History of falls.  Memory and ability to understand (cognition).  Work and work Astronomer. Screening  You may have the following tests or measurements:  Height, weight, and BMI.  Blood pressure.  Lipid and cholesterol levels. These may be checked every 5 years, or more frequently if you are over 89 years old.  Skin check.  Lung cancer screening. You may have this screening every year starting at age 37 if you have a 30-pack-year history of smoking and currently smoke or have quit within the past 15 years.  Fecal occult blood test (FOBT) of the stool. You may have this test every year starting at age 12.  Flexible sigmoidoscopy or colonoscopy. You may have a sigmoidoscopy every 5 years or a colonoscopy every 10 years starting at age 29.  Prostate cancer screening. Recommendations will vary depending on your family history and other risks.  Hepatitis C blood test.  Hepatitis B blood test.  Sexually transmitted disease (STD) testing.  Diabetes screening. This is done by checking your blood sugar (glucose) after you have not eaten for a while (fasting). You may have this done every 1-3 years.  Abdominal aortic aneurysm (AAA) screening. You may need this if you are a current or former smoker.  Osteoporosis. You may be screened starting at age 82 if you are at high risk. Talk with your health care provider about your test results, treatment options, and if necessary, the need for more tests. Vaccines  Your health care provider may recommend certain vaccines, such as:  Influenza vaccine. This is recommended every year.  Tetanus, diphtheria, and acellular  pertussis (Tdap, Td) vaccine. You may need a Td booster every 10 years.  Zoster vaccine. You may need this after age 59.  Pneumococcal 13-valent conjugate (PCV13) vaccine. One dose is recommended after age 36.  Pneumococcal polysaccharide (PPSV23) vaccine.  One dose is recommended after age 42. Talk to your health care provider about which screenings and vaccines you need and how often you need them. This information is not intended to replace advice given to you by your health care provider. Make sure you discuss any questions you have with your health care provider. Document Released: 11/07/2015 Document Revised: 06/30/2016 Document Reviewed: 08/12/2015 Elsevier Interactive Patient Education  2017 [place] Prevention in the Home Falls can cause injuries. They can happen to people of all ages. There are many things you can do to make your home safe and to help prevent falls. What can I do on the outside of my home?  Regularly fix the edges of walkways and driveways and fix any cracks.  Remove anything that might make you trip as you walk through a door, such as a raised step or threshold.  Trim any bushes or trees on the path to your home.  Use bright outdoor lighting.  Clear any walking paths of anything that might make someone trip, such as rocks or tools.  Regularly check to see if handrails are loose or broken. Make sure that both sides of any steps have handrails.  Any raised decks and porches should have guardrails on the edges.  Have any leaves, snow, or ice cleared regularly.  Use sand or salt on walking paths during winter.  Clean up any spills in your garage right away. This includes oil or grease spills. What can I do in the bathroom?  Use night lights.  Install grab bars by the toilet and in the tub and shower. Do not use towel bars as grab bars.  Use non-skid mats or decals in the tub or shower.  If you need to sit down in the shower, use a plastic, non-slip stool.  Keep the floor dry. Clean up any water that spills on the floor as soon as it happens.  Remove soap buildup in the tub or shower regularly.  Attach bath mats securely with double-sided non-slip rug tape.  Do not have throw rugs and other  things on the floor that can make you trip. What can I do in the bedroom?  Use night lights.  Make sure that you have a light by your bed that is easy to reach.  Do not use any sheets or blankets that are too big for your bed. They should not hang down onto the floor.  Have a firm chair that has side arms. You can use this for support while you get dressed.  Do not have throw rugs and other things on the floor that can make you trip. What can I do in the kitchen?  Clean up any spills right away.  Avoid walking on wet floors.  Keep items that you use a lot in easy-to-reach places.  If you need to reach something above you, use a strong step stool that has a grab bar.  Keep electrical cords out of the way.  Do not use floor polish or wax that makes floors slippery. If you must use wax, use non-skid floor wax.  Do not have throw rugs and other things on the floor that can make you trip. What can I do with my stairs?  Do not leave any items on the stairs.  Make sure that there are handrails on both sides of the stairs and use them. Fix handrails that are broken or loose. Make sure that handrails are as long as the stairways.  Check any carpeting to make sure that it is firmly attached to the stairs. Fix any carpet that is loose or worn.  Avoid having throw rugs at the top or bottom of the stairs. If you do have throw rugs, attach them to the floor with carpet tape.  Make sure that you have a light switch at the top of the stairs and the bottom of the stairs. If you do not have them, ask someone to add them for you. What else can I do to help prevent falls?  Wear shoes that:  Do not have high heels.  Have rubber bottoms.  Are comfortable and fit you well.  Are closed at the toe. Do not wear sandals.  If you use a stepladder:  Make sure that it is fully opened. Do not climb a closed stepladder.  Make sure that both sides of the stepladder are locked into place.  Ask  someone to hold it for you, if possible.  Clearly mark and make sure that you can see:  Any grab bars or handrails.  First and last steps.  Where the edge of each step is.  Use tools that help you move around (mobility aids) if they are needed. These include:  Canes.  Walkers.  Scooters.  Crutches.  Turn on the lights when you go into a dark area. Replace any light bulbs as soon as they burn out.  Set up your furniture so you have a clear path. Avoid moving your furniture around.  If any of your floors are uneven, fix them.  If there are any pets around you, be aware of where they are.  Review your medicines with your doctor. Some medicines can make you feel dizzy. This can increase your chance of falling. Ask your doctor what other things that you can do to help prevent falls. This information is not intended to replace advice given to you by your health care provider. Make sure you discuss any questions you have with your health care provider. Document Released: 08/07/2009 Document Revised: 03/18/2016 Document Reviewed: 11/15/2014 Elsevier Interactive Patient Education  2017 [employer].

## 2019-12-11 NOTE — Progress Notes (Signed)
Subjective:   Dalton Fleming is a 68 y.o. male who presents for an Initial Medicare Annual Wellness Visit.  This visit is being conducted via phone call  - after an attmept to do on video chat - due to the COVID-19 pandemic. This patient has given me verbal consent via phone to conduct this visit, patient states they are participating from their home address. Some vital signs may be absent or patient reported.   Patient identification: identified by name, DOB, and current address.    Review of Systems   Cardiac Risk Factors include: advanced age (>25men, >72 women)    Objective:    Today's Vitals   12/11/19 1456 12/11/19 1513  Weight: 105 lb (47.6 kg)   PainSc:  5    Body mass index is 16.45 kg/m.  Advanced Directives 12/11/2019 08/26/2019 08/25/2019 06/23/2016  Does Patient Have a Medical Advance Directive? No No No No  Would patient like information on creating a medical advance directive? - No - Patient declined - No - patient declined information    Current Medications (verified) Outpatient Encounter Medications as of 12/11/2019  Medication Sig  . aspirin EC 81 MG tablet Take 1 tablet (81 mg total) by mouth daily.  . meloxicam (MOBIC) 15 MG tablet TAKE ONE TABLET BY MOUTH EVERY DAY AS NEEDED FOR PAIN (Patient taking differently: Take 15 mg by mouth daily as needed for pain. )  . tamsulosin (FLOMAX) 0.4 MG CAPS capsule Take 1 capsule (0.4 mg total) by mouth daily.   No facility-administered encounter medications on file as of 12/11/2019.    Allergies (verified) Codeine   History: Past Medical History:  Diagnosis Date  . Affective disorder (HCC) 12/12/2012  . Affective disorder, major 07/10/2013  . BPH (benign prostatic hyperplasia)   . Chronic chest wall pain 07/01/2017  . DDD (degenerative disc disease), cervical 12/12/2012  . Depression   . Hx of hepatitis C   . Impotence   . Inability to urinate   . Mononeuropathy of upper extremity 12/12/2012  . Panic  disorder without agoraphobia 04/18/2013  . Peyronie's disease   . Rectal polyp 06/09/2016  . Sebaceous cyst 06/09/2016  . Urinary frequency    Past Surgical History:  Procedure Laterality Date  . APPENDECTOMY    . CERVICAL SPINE SURGERY    . HERNIA REPAIR     Family History  Problem Relation Age of Onset  . Heart disease Father   . Breast cancer Sister   . Leukemia Mother   . Diabetes Maternal Grandmother   . Colon cancer Paternal Grandfather   . Heart Problems Maternal Grandfather   . Prostate cancer Neg Hx    Social History   Socioeconomic History  . Marital status: Widowed    Spouse name: Not on file  . Number of children: Not on file  . Years of education: Not on file  . Highest education level: Not on file  Occupational History  . Occupation: retired   Tobacco Use  . Smoking status: Light Tobacco Smoker    Packs/day: 0.50    Years: 20.00    Pack years: 10.00    Last attempt to quit: 06/27/2016    Years since quitting: 3.4  . Smokeless tobacco: Never Used  . Tobacco comment: 1 pack lasts a few days   Substance and Sexual Activity  . Alcohol use: Yes    Alcohol/week: 0.0 standard drinks    Comment: hard cider occasionally   . Drug use: Yes  Types: Marijuana    Comment: pain control and appetite.   . Sexual activity: Never  Other Topics Concern  . Not on file  Social History Narrative  . Not on file   Social Determinants of Health   Financial Resource Strain:   . Difficulty of Paying Living Expenses: Not on file  Food Insecurity:   . Worried About Programme researcher, broadcasting/film/video in the Last Year: Not on file  . Ran Out of Food in the Last Year: Not on file  Transportation Needs:   . Lack of Transportation (Medical): Not on file  . Lack of Transportation (Non-Medical): Not on file  Physical Activity:   . Days of Exercise per Week: Not on file  . Minutes of Exercise per Session: Not on file  Stress:   . Feeling of Stress : Not on file  Social Connections:   .  Frequency of Communication with Friends and Family: Not on file  . Frequency of Social Gatherings with Friends and Family: Not on file  . Attends Religious Services: Not on file  . Active Member of Clubs or Organizations: Not on file  . Attends Banker Meetings: Not on file  . Marital Status: Not on file   Tobacco Counseling Ready to quit: Not Answered Counseling given: Not Answered Comment: 1 pack lasts a few days    Clinical Intake:  Pre-visit preparation completed: Yes  Pain : 0-10 Pain Score: 5  Pain Type: Chronic pain Pain Location: Shoulder Pain Orientation: Left Pain Descriptors / Indicators: Aching, Radiating Pain Onset: More than a month ago Pain Frequency: Constant Pain Relieving Factors: smoking marujana  Pain Relieving Factors: smoking marujana  Nutritional Status: BMI <19  Underweight Nutritional Risks: None Diabetes: No  How often do you need to have someone help you when you read instructions, pamphlets, or other written materials from your doctor or pharmacy?: 1 - Never  Interpreter Needed?: No  Information entered by :: Adraine Biffle,LPN  Activities of Daily Living In your present state of health, do you have any difficulty performing the following activities: 12/11/2019 08/26/2019  Hearing? N -  Comment no hearing aids -  Vision? N -  Comment eyeglasses, goes to Dr.Nice -  Difficulty concentrating or making decisions? N -  Walking or climbing stairs? N -  Dressing or bathing? N -  Doing errands, shopping? N N  Preparing Food and eating ? N -  Using the Toilet? N -  In the past six months, have you accidently leaked urine? N -  Do you have problems with loss of bowel control? N -  Managing your Medications? N -  Managing your Finances? N -  Housekeeping or managing your Housekeeping? N -  Some recent data might be hidden     Immunizations and Health Maintenance Immunization History  Administered Date(s) Administered  .  Influenza,inj,Quad PF,6+ Mos 07/01/2017  . Pneumococcal Conjugate-13 07/01/2017   Health Maintenance Due  Topic Date Due  . Hepatitis C Screening  Jan 24, 1951  . PNA vac Low Risk Adult (2 of 2 - PPSV23) 07/01/2018    Patient Care Team: Smitty Cords, DO as PCP - General (Family Medicine)  Indicate any recent Medical Services you may have received from other than [facility] providers in the past year (date may be approximate).    Assessment:   This is a routine wellness examination for Dalton Fleming.  Hearing/Vision screen No exam data present  Dietary issues and exercise activities discussed: Current Exercise Habits: The  patient does not participate in regular exercise at present, Exercise limited by: None identified  Goals   None    Depression Screen PHQ 2/9 Scores 12/11/2019 06/20/2018 06/10/2016  PHQ - 2 Score 1 1 6   PHQ- 9 Score - 8 22    Fall Risk Fall Risk  12/11/2019 06/20/2018 06/10/2016  Falls in the past year? 0 No Yes  Number falls in past yr: 0 - 2 or more  Injury with Fall? 0 - -    FALL RISK PREVENTION PERTAINING TO THE HOME:  Any stairs in or around the home? No  If so, are there any without handrails? No   Home free of loose throw rugs in walkways, pet beds, electrical cords, etc? Yes  Adequate lighting in your home to reduce risk of falls? Yes   ASSISTIVE DEVICES UTILIZED TO PREVENT FALLS:  Life alert? No  Use of a cane, walker or w/c? No  Grab bars in the bathroom? No  Shower chair or bench in shower? No  Elevated toilet seat or a handicapped toilet? No    TIMED UP AND GO:  Unable to perform    Cognitive Function:        Screening Tests Health Maintenance  Topic Date Due  . Hepatitis C Screening  June 21, 1951  . PNA vac Low Risk Adult (2 of 2 - PPSV23) 07/01/2018  . INFLUENZA VACCINE  01/23/2020 (Originally 05/26/2019)  . TETANUS/TDAP  12/10/2020 (Originally 01/25/1971)  . COLONOSCOPY  09/17/2024    Qualifies for Shingles Vaccine? Yes   Zostavax completed n/a . Due for Shingrix. Education has been provided regarding the importance of this vaccine. Pt has been advised to call insurance company to determine out of pocket expense. Advised may also receive vaccine at local pharmacy or Health Dept. Verbalized acceptance and understanding.  Tdap: due now.   Flu Vaccine: due now, declined   Pneumococcal Vaccine: due now  Covid-19: information provided.   Cancer Screenings:   Colorectal Screening: Completed 2015. Repeat every 2025 years  Lung Cancer Screening: (Low Dose CT Chest recommended if Age 47-80 years, 30 pack-year currently smoking OR have quit w/in 15years.) does qualify.    Additional Screening:  Hepatitis C Screening: does qualify  Vision Screening: Recommended annual ophthalmology exams for early detection of glaucoma and other disorders of the eye. Is the patient up to date with their annual eye exam?  Yes  Who is the provider or what is the name of the office in which the pt attends annual eye exams? Dr.Nice   Dental Screening: Recommended annual dental exams for proper oral hygiene  Community Resource Referral:  CRR required this visit?  No        Plan:  I have personally reviewed and addressed the Medicare Annual Wellness questionnaire and have noted the following in the patient's chart:  A. Medical and social history B. Use of alcohol, tobacco or illicit drugs  C. Current medications and supplements D. Functional ability and status E.  Nutritional status F.  Physical activity G. Advance directives H. List of other physicians I.  Hospitalizations, surgeries, and ER visits in previous 12 months J.  [place] such as hearing and vision if needed, cognitive and depression L. Referrals and appointments   In addition, I have reviewed and discussed with patient certain preventive protocols, quality metrics, and best practice recommendations. A written personalized care plan for  preventive services as well as general preventive health recommendations were provided to patient.   Signed,  Collene Schlichter, [place]   5/36/1443  Nurse Health Advisor   Nurse Notes: Needs refill on tamsulosin. Scheduled virtual follow up apt. Patient had questions regarding hospital stay and didn't come in for hospital follow up. Doesn't want to come in for appointments due to pandemic.

## 2019-12-14 ENCOUNTER — Other Ambulatory Visit: Payer: Self-pay

## 2019-12-14 ENCOUNTER — Encounter: Payer: Self-pay | Admitting: Family Medicine

## 2019-12-14 ENCOUNTER — Ambulatory Visit (INDEPENDENT_AMBULATORY_CARE_PROVIDER_SITE_OTHER): Payer: Medicare PPO | Admitting: Family Medicine

## 2019-12-14 DIAGNOSIS — N400 Enlarged prostate without lower urinary tract symptoms: Secondary | ICD-10-CM

## 2019-12-14 MED ORDER — TAMSULOSIN HCL 0.4 MG PO CAPS: 0.8000 mg | ORAL_CAPSULE | Freq: Every day | ORAL | 1 refills | Status: DC

## 2019-12-14 NOTE — Progress Notes (Signed)
Virtual Visit via Telephone The purpose of this virtual visit is to provide medical care while limiting exposure to the novel coronavirus (COVID19) for both patient and office staff.  Consent was obtained for phone visit:  Yes.   Answered questions that patient had about telehealth interaction:  Yes.   I discussed the limitations, risks, security and privacy concerns of performing an evaluation and management service by telephone. I also discussed with the patient that there may be a patient responsible charge related to this service. The patient expressed understanding and agreed to proceed.  Patient Location: Home Provider Location: [facility])  ---------------------------------------------------------------------- Chief Complaint  Patient presents with  . Benign Prostatic Hypertrophy    S: Reviewed CMA documentation. I have called patient and gathered additional HPI as follows:  BPH with LUTS Reports chronic history of this problem.  He has chronic issue with takes longer to void at times, admits urinary stream weaker and increased need to strain with urination. Admits nocturia 1-3 Taking Flomax 0.4mg  daily in AM He has record of previously taking Finasteride. But he does not recall this at all.  AUA BPH Symptom Score over past 1 month 1. Sensation of not emptying bladder post void - 2 2. Urinate less than 2 hour after finish last void - 0 3. Start/Stop several times during void - 2 4. Difficult to postpone urination - 2 5. Weak urinary stream - 4 6. Push or strain urination - 4 7. Nocturia - 2 times  Total Score: 16 (Moderate BPH symptoms)  Denies any high risk travel to areas of current concern for COVID19. Denies any known or suspected exposure to person with or possibly with COVID19.  Denies any fevers, chills, sweats, body ache, cough, shortness of breath, sinus pain or pressure, headache, abdominal pain, diarrhea  Past Medical History:   Diagnosis Date  . Affective disorder ([place]) 12/12/2012  . Affective disorder, major 07/10/2013  . BPH (benign prostatic hyperplasia)   . Chronic chest wall pain 07/01/2017  . DDD (degenerative disc disease), cervical 12/12/2012  . Depression   . Hx of hepatitis C   . Impotence   . Inability to urinate   . Mononeuropathy of upper extremity 12/12/2012  . Panic disorder without agoraphobia 04/18/2013  . Peyronie's disease   . Rectal polyp 06/09/2016  . Sebaceous cyst 06/09/2016  . Urinary frequency    Social History   Tobacco Use  . Smoking status: Light Tobacco Smoker    Packs/day: 0.50    Years: 20.00    Pack years: 10.00    Last attempt to quit: 06/27/2016    Years since quitting: 3.4  . Smokeless tobacco: Never Used  . Tobacco comment: 1 pack lasts a few days   Substance Use Topics  . Alcohol use: Yes    Alcohol/week: 0.0 standard drinks    Comment: hard cider occasionally   . Drug use: Yes    Types: Marijuana    Comment: pain control and appetite.     Current Outpatient Medications:  .  aspirin EC 81 MG tablet, Take 1 tablet (81 mg total) by mouth daily., Disp: 100 tablet, Rfl: 12 .  meloxicam (MOBIC) 15 MG tablet, TAKE ONE TABLET BY MOUTH EVERY DAY AS NEEDED FOR PAIN (Patient taking differently: Take 15 mg by mouth daily as needed for pain. ), Disp: 30 tablet, Rfl: 0 .  tamsulosin (FLOMAX) 0.4 MG CAPS capsule, Take 2 capsules (0.8 mg total) by mouth daily., Disp: 180 capsule, Rfl:  1  Depression screen [facility] 2/9 12/14/2019 12/11/2019 06/20/2018  Decreased Interest 2 0 1  Down, Depressed, Hopeless 1 1 0  PHQ - 2 Score 3 1 1   Altered sleeping 0 - 3  Tired, decreased energy 0 - 3  Change in appetite 0 - 0  Feeling bad or failure about yourself  0 - 0  Trouble concentrating 0 - 0  Moving slowly or fidgety/restless 0 - 1  Suicidal thoughts 0 - 0  PHQ-9 Score 3 - 8  Difficult doing work/chores Not difficult at all - Not difficult at all    GAD 7 : Generalized Anxiety Score  12/14/2019 06/20/2018  Nervous, Anxious, on Edge 0 0  Control/stop worrying 1 1  Worry too much - different things 1 2  Trouble relaxing 0 2  Restless 0 0  Easily annoyed or irritable 0 0  Afraid - awful might happen 1 0  Total GAD 7 Score 3 5  Anxiety Difficulty Not difficult at all Not difficult at all    -------------------------------------------------------------------------- O: No physical exam performed due to remote telephone encounter.  Lab results reviewed.  No results found for this or any previous visit (from the past 2160 hour(s)).  -------------------------------------------------------------------------- A&P:  Problem List Items Addressed This Visit    BPH (benign prostatic hyperplasia) - Primary   Relevant Medications   tamsulosin (FLOMAX) 0.4 MG CAPS capsule     Worsening chronic BPH  AUA BPH LUTS score is higher now - On Flomax 0.4, Question if he ever took Finasteride - Last PSA 1.4 (2013) - No known personal/family history of prostate CA  Plan: 1. INCREASE DOSE - Double Tamsulosin from 0.4mg  daily up to 0.8mg  daily (x 2 pills) new rx sent daily, advised on benefits, risks, if BP low caution with sudden standing up or position change 2. Follow-up as needed Future offer referral to return to Urology but he is hesitant said previous Urologist retired without telling him  Meds ordered this encounter  Medications  . tamsulosin (FLOMAX) 0.4 MG CAPS capsule    Sig: Take 2 capsules (0.8 mg total) by mouth daily.    Dispense:  180 capsule    Refill:  1    Dose increase    Follow-up: - Return in 3-6 months for BPH  Patient verbalizes understanding with the above medical recommendations including the limitation of remote medical advice.  Specific follow-up and call-back criteria were given for patient to follow-up or seek medical care more urgently if needed.   - Time spent in direct consultation with patient on phone: 9 minutes   05-25-1970, DO [facility] 12/14/2019, 2:50 PM

## 2019-12-17 ENCOUNTER — Telehealth: Payer: Self-pay | Admitting: Family Medicine

## 2019-12-17 NOTE — Chronic Care Management (AMB) (Signed)
  Chronic Care Management   Note  12/17/2019 Name: Arieh Bogue MRN: [id number] DOB: [birth year]  Kamaal Cast Albea is a 69 y.o. year old male who is a primary care patient of Olin Hauser, DO. I reached out to [employer] by phone today in response to a referral sent by Mr. Aundra Espin Blankenbaker's PCP, Dr. Nobie Putnam     Mr. Molder was given information about Chronic Care Management services today including:  1. CCM service includes personalized support from designated clinical staff supervised by his physician, including individualized plan of care and coordination with other care providers 2. 24/7 contact phone numbers for assistance for urgent and routine care needs. 3. Service will only be billed when office clinical staff spend 20 minutes or more in a month to coordinate care. 4. Only one practitioner may furnish and bill the service in a calendar month. 5. The patient may stop CCM services at any time (effective at the end of the month) by phone call to the office staff. 6. The patient will be responsible for cost sharing (co-pay) of up to 20% of the service fee (after annual deductible is met).  Patient agreed to services and verbal consent obtained.   Follow up plan: Telephone appointment with care management team member scheduled for:01/24/2020  Glenna Durand, LPN Health Advisor, [facility] ??[email or web address] ??[phone]

## 2020-01-24 ENCOUNTER — Ambulatory Visit (INDEPENDENT_AMBULATORY_CARE_PROVIDER_SITE_OTHER): Payer: Medicare PPO | Admitting: General Practice

## 2020-01-24 ENCOUNTER — Telehealth: Payer: Medicare PPO | Admitting: General Practice

## 2020-01-24 DIAGNOSIS — M5030 Other cervical disc degeneration, unspecified cervical region: Secondary | ICD-10-CM

## 2020-01-24 DIAGNOSIS — G8929 Other chronic pain: Secondary | ICD-10-CM

## 2020-01-24 DIAGNOSIS — F419 Anxiety disorder, unspecified: Secondary | ICD-10-CM

## 2020-01-24 DIAGNOSIS — F39 Unspecified mood [affective] disorder: Secondary | ICD-10-CM

## 2020-01-24 DIAGNOSIS — N400 Enlarged prostate without lower urinary tract symptoms: Secondary | ICD-10-CM | POA: Diagnosis not present

## 2020-01-24 DIAGNOSIS — R0789 Other chest pain: Secondary | ICD-10-CM

## 2020-01-24 NOTE — Patient Instructions (Signed)
Visit Information  Goals Addressed            This Visit's Progress   . RNCM: Pt-"I have several health problems" (pt-stated)       CARE PLAN ENTRY (see longtitudinal plan of care for additional care plan information)  Current Barriers:  . Chronic Disease Management support, education, and care coordination needs related to pericarditis, DDD, BPH, Affective disorder and anxiety  Clinical Goal(s) related to pericarditis, DDD, BPH, Affective disorder and anxiety:  Over the next 120 days, patient will:  . Work with the care management team to address educational, disease management, and care coordination needs  . Begin or continue self health monitoring activities as directed today  increased incidence of panic attacks and further weight loss . Call provider office for new or worsened signs and symptoms Weight outside established parameters, Shortness of breath, and New or worsened symptom related to pericarditis, BPH, Affective disorder and anxiety . Call care management team with questions or concerns . Verbalize basic understanding of patient centered plan of care established today  Interventions related to pericarditis, DDD, BPH, Affective disorder and anxiety  . Evaluation of current treatment plans and patient's adherence to plan as established by provider.  The patient states he is having more panic attacks and more frequently.  Discussed if the patient had discussed this with his provider and he said he had. He use to see a pain specialist but stopped. Ask about a referral to a psychiatrist for management of panic attacks but the patient declined.  . Assessed patient understanding of disease states . Assessed patient's education and care coordination needs.  Patient has several chronic conditions and would benefit from the resources available. The patient agrees to work with the [facility]; however declines LCSW or pharmacist support at this time.  . Provided disease specific education to  patient  . Collaborated with appropriate clinical care team members regarding patient needs  Patient Self Care Activities related to {pericarditis, DDD, BPH, Affective disorder and anxiety . Patient is unable to independently self-manage chronic health conditions  Initial goal documentation     . RNCM: Pt-"I need help with nutrition" (pt-stated)       CARE PLAN ENTRY (see longtitudinal plan of care for additional care plan information)  Current Barriers:  Marland Kitchen Knowledge Deficits related to nutritional deficits as evidence of poor appetite and unintentional weight loss.  Leodis [place] caregiver support.  . Film/video editor.  . Non-adherence to prescribed medication regimen  Nurse Case Manager Clinical Goal(s):  Marland Kitchen Over the next 120 days, patient will verbalize understanding of plan for supplying Ensure supplements and coupons to help the patient with nutritional status . Over the next 120 days, patient will work with [facility] and pcp to address needs related to Nutritional needs and unintentional weight loss . Over the next 120 days, patient will demonstrate improved health management independence as evidenced byno further weight loss  Interventions:  . Evaluation of current treatment plan related to unintentional weight loss and patient's adherence to plan as established by provider. . Advised patient to try and eat more calories in meals.  The patient needs needs to put on weight. He only eats one meal a day but does drink supplements when he has them.  . Provided education to patient re: the ability to work with a nutritionist to help with nutrition and unintentional weight loss. The patient declines referral to nutritionist at this time . Discussed plans with patient for ongoing care management follow up  and provided patient with direct contact information for care management team . Provided patient and/or caregiver with resources in the community and discussing with pcp information about  referrals and food resources to help promote nutrition and healthy weight gain. Patient declines care guide referral  but agrees to pick up Ensure samples from the office.   Patient Self Care Activities:  . Patient verbalizes understanding of plan to pick up Ensure samples from the MD office next week and work with [facility] to meet nutrition and prevent further weight loss . Attends all scheduled provider appointments . Performs ADL's independently . Performs IADL's independently . Calls provider office for new concerns or questions . Unable to independently manage nutritional status.  . Unable to self administer medications as prescribed . Does not contact provider office for questions/concerns  Initial goal documentation        Mr. Umland was given information about Chronic Care Management services today including:  1. CCM service includes personalized support from designated clinical staff supervised by his physician, including individualized plan of care and coordination with other care providers 2. 24/7 contact phone numbers for assistance for urgent and routine care needs. 3. Service will only be billed when office clinical staff spend 20 minutes or more in a month to coordinate care. 4. Only one practitioner may furnish and bill the service in a calendar month. 5. The patient may stop CCM services at any time (effective at the end of the month) by phone call to the office staff. 6. The patient will be responsible for cost sharing (co-pay) of up to 20% of the service fee (after annual deductible is met).  Patient agreed to services and verbal consent obtained.   Patient verbalizes understanding of instructions provided today.   The care management team will reach out to the patient again over the next 60 days.   Noreene Larsson RN, MSN, [place] Mobile: [phone]

## 2020-01-24 NOTE — Chronic Care Management (AMB) (Signed)
Chronic Care Management   Initial Visit Note  01/24/2020 Name: Dalton Fleming MRN: [id number] DOB: 1950-11-29  Referred by: Olin Hauser, DO Reason for referral : Chronic Care Management (Initial: RNCM Chronic Disease Management and Care Coordination needs)   Dalton Fleming is a 69 y.o. year old male who is a primary care patient of Olin Hauser, DO. The CCM team was consulted for assistance with chronic disease management and care coordination needs related to pericarditis, BPH, affective disorder, and anxiety.  Review of patient status, including review of consultants reports, relevant laboratory and other test results, and collaboration with appropriate care team members and the patient's provider was performed as part of comprehensive patient evaluation and provision of chronic care management services.    SDOH (Social Determinants of Health) assessments performed: Yes See Care Plan activities for detailed interventions related to SDOH  SDOH Interventions     Most Recent Value  SDOH Interventions  SDOH Interventions for the Following Domains  Physical Activity, Stress, Depression, Tobacco, Financial Strain  Financial Strain Interventions  Other (Comment) [offered careguide referral, patient declined]  Physical Activity Interventions  Other (Comments) [the patient states he stays inside and does not go out, does clean his room]  Stress Interventions  Provide Counseling, Other (Comment), Patient Refused [offered LCSW referral but the patient declined]  Tobacco Interventions  Patient Refused  Depression Interventions/Treatment   Patient refuses Treatment [The patient states that he has talked to pcp about panic attacks but he has not been back to pain MD/declines specialtist]       Medications: Outpatient Encounter Medications as of 01/24/2020  Medication Sig   aspirin EC 81 MG tablet Take 1 tablet (81 mg total) by mouth daily.   meloxicam (MOBIC) 15  MG tablet TAKE ONE TABLET BY MOUTH EVERY DAY AS NEEDED FOR PAIN (Patient taking differently: Take 15 mg by mouth daily as needed for pain. )   tamsulosin (FLOMAX) 0.4 MG CAPS capsule Take 2 capsules (0.8 mg total) by mouth daily.   No facility-administered encounter medications on file as of 01/24/2020.     Objective:  BP Readings from Last 3 Encounters:  08/27/19 117/81  06/20/18 136/87  08/30/17 118/69    Goals Addressed            This Visit's Progress    RNCM: Pt-"I have several health problems" (pt-stated)       CARE PLAN ENTRY (see longtitudinal plan of care for additional care plan information)  Current Barriers:   Chronic Disease Management support, education, and care coordination needs related to pericarditis, DDD, BPH, Affective disorder and anxiety  Clinical Goal(s) related to pericarditis, DDD, BPH, Affective disorder and anxiety:  Over the next 120 days, patient will:   Work with the care management team to address educational, disease management, and care coordination needs   Begin or continue self health monitoring activities as directed today  increased incidence of panic attacks and further weight loss  Call provider office for new or worsened signs and symptoms Weight outside established parameters, Shortness of breath, and New or worsened symptom related to pericarditis, BPH, Affective disorder and anxiety  Call care management team with questions or concerns  Verbalize basic understanding of patient centered plan of care established today  Interventions related to pericarditis, DDD, BPH, Affective disorder and anxiety   Evaluation of current treatment plans and patient's adherence to plan as established by provider.  The patient states he is having more panic attacks and more  frequently.  Discussed if the patient had discussed this with his provider and he said he had. He use to see a pain specialist but stopped. Ask about a referral to a psychiatrist  for management of panic attacks but the patient declined.   Assessed patient understanding of disease states  Assessed patient's education and care coordination needs.  Patient has several chronic conditions and would benefit from the resources available. The patient agrees to work with the [facility]; however declines LCSW or pharmacist support at this time.   Provided disease specific education to patient   Collaborated with appropriate clinical care team members regarding patient needs  Patient Self Care Activities related to {pericarditis, DDD, BPH, Affective disorder and anxiety  Patient is unable to independently self-manage chronic health conditions  Initial goal documentation      RNCM: Pt-"I need help with nutrition" (pt-stated)       CARE PLAN ENTRY (see longtitudinal plan of care for additional care plan information)  Current Barriers:   Knowledge Deficits related to nutritional deficits as evidence of poor appetite and unintentional weight loss.   Lacks caregiver support.   Film/video editor.   Non-adherence to prescribed medication regimen  Nurse Case Manager Clinical Goal(s):   Over the next 120 days, patient will verbalize understanding of plan for supplying Ensure supplements and coupons to help the patient with nutritional status  Over the next 120 days, patient will work with [facility] and pcp to address needs related to Nutritional needs and unintentional weight loss  Over the next 120 days, patient will demonstrate improved health management independence as evidenced byno further weight loss  Interventions:   Evaluation of current treatment plan related to unintentional weight loss and patient's adherence to plan as established by provider.  Advised patient to try and eat more calories in meals.  The patient needs needs to put on weight. He only eats one meal a day but does drink supplements when he has them.   Provided education to patient re: the ability to  work with a nutritionist to help with nutrition and unintentional weight loss. The patient declines referral to nutritionist at this time  Discussed plans with patient for ongoing care management follow up and provided patient with direct contact information for care management team  Provided patient and/or caregiver with resources in the community and discussing with pcp information about referrals and food resources to help promote nutrition and healthy weight gain. Patient declines care guide referral  but agrees to pick up Ensure samples from the office.   Patient Self Care Activities:   Patient verbalizes understanding of plan to pick up Ensure samples from the MD office next week and work with [facility] to meet nutrition and prevent further weight loss  Attends all scheduled provider appointments  Performs ADL's independently  Performs IADL's independently  Calls provider office for new concerns or questions  Unable to independently manage nutritional status.   Unable to self administer medications as prescribed  Does not contact provider office for questions/concerns  Initial goal documentation         Mr. Milian was given information about Chronic Care Management services today including:  1. CCM service includes personalized support from designated clinical staff supervised by his physician, including individualized plan of care and coordination with other care providers 2. 24/7 contact phone numbers for assistance for urgent and routine care needs. 3. Service will only be billed when office clinical staff spend 20 minutes or more in a month to  coordinate care. 4. Only one practitioner may furnish and bill the service in a calendar month. 5. The patient may stop CCM services at any time (effective at the end of the month) by phone call to the office staff. 6. The patient will be responsible for cost sharing (co-pay) of up to 20% of the service fee (after annual deductible is  met).  Patient agreed to services and verbal consent obtained.   Plan:   The care management team will reach out to the patient again over the next 60 days.   Noreene Larsson RN, MSN, [employer] Mobile: [phone]

## 2020-03-20 ENCOUNTER — Telehealth: Payer: Self-pay

## 2020-03-20 ENCOUNTER — Ambulatory Visit: Payer: Self-pay | Admitting: General Practice

## 2020-03-20 NOTE — Chronic Care Management (AMB) (Signed)
  Chronic Care Management   Outreach Note  03/20/2020 Name: Dalton Fleming MRN: [id number] DOB: 09/02/1951  Referred by: Smitty Cords, DO Reason for referral : Chronic Care Management (Follow up: RNCM Chronic disease management and care coordination needs. )   An unsuccessful telephone outreach was attempted today. The patient was referred to the case management team for assistance with care management and care coordination.   Follow Up Plan: The care management team will reach out to the patient again over the next 30 to 60 days.   Alto Denver RN, MSN, CCM Community Care Coordinator [place]  [facility] [place] Mobile: [phone]

## 2020-04-23 ENCOUNTER — Telehealth: Payer: Self-pay | Admitting: Family Medicine

## 2020-04-23 NOTE — Chronic Care Management (AMB) (Signed)
  Chronic Care Management   Note  04/23/2020 Name: Tyric Rodeheaver MRN: [id number] DOB: 07-07-1951  Jasean Ambrosia Elsen is a 69 y.o. year old male who is a primary care patient of Smitty Cords, DO and is actively engaged with the care management team. I reached out to Betsey Holiday by phone today to assist with re-scheduling a follow up visit with the RN Case Manager.  Follow up plan: Unsuccessful telephone outreach attempt made. The care management team will reach out to the patient again over the next 7 days. If patient returns call to provider office, please advise to call Embedded Care Management Care Guide Gwenevere Ghazi at [phone].  Gwenevere Ghazi  Care Guide, Embedded Care Coordination [facility]  [place], [place] [address] Direct Dial: [phone] [email or web address]: [email or web address]

## 2020-04-24 NOTE — Chronic Care Management (AMB) (Signed)
  Chronic Care Management   Note  04/24/2020 Name: Isaiyah Feldhaus MRN: [id number] DOB: 03-02-1951  Dalton Fleming is a 69 y.o. year old male who is a primary care patient of Smitty Cords, DO and is actively engaged with the care management team. I reached out to Betsey Holiday by phone today to assist with re-scheduling a follow up visit with the RN Case Manager.  Follow up plan: Telephone appointment with care management team member scheduled for:05/29/2020.  Gwenevere Ghazi  Care Guide, Embedded Care Coordination [facility]  [place], [place] [address] Direct Dial: [phone] [email or web address]: [email or web address]

## 2020-05-12 ENCOUNTER — Telehealth: Payer: Self-pay

## 2020-05-20 ENCOUNTER — Encounter: Payer: Self-pay | Admitting: Family Medicine

## 2020-05-29 ENCOUNTER — Telehealth: Payer: Self-pay

## 2020-05-29 ENCOUNTER — Telehealth: Payer: Self-pay | Admitting: General Practice

## 2020-05-29 NOTE — Telephone Encounter (Cosign Needed)
  Chronic Care Management   Outreach Note  05/29/2020 Name: Dalton Fleming MRN: [id number] DOB: 1950-12-25  Referred by: Smitty Cords, DO Reason for referral : Appointment (RNCM Follow up: 2nd attempt for Chronic Disease Managment and Care Coordination Needs)   A second unsuccessful telephone outreach was attempted today. The patient was referred to the case management team for assistance with care management and care coordination. The patient called back after the [facility] was unable to leave a message. The patient verbalized he did not have time to talk to the [facility] and would call back in the future if he had any needs.   Follow Up Plan: The care management team is available to follow up with the patient after provider conversation with the patient regarding recommendation for care management engagement and subsequent re-referral to the care management team.   Alto Denver RN, MSN, CCM Community Care Coordinator [facility]  [facility] [place] Mobile: [phone]

## 2020-06-05 ENCOUNTER — Other Ambulatory Visit: Payer: Self-pay | Admitting: Family Medicine

## 2020-06-05 DIAGNOSIS — N400 Enlarged prostate without lower urinary tract symptoms: Secondary | ICD-10-CM

## 2020-06-05 NOTE — Telephone Encounter (Signed)
Requested  medications are  due for refill today yes  Requested medications are on the active medication list yes  Last refill 5/18  Last visit Feb 2021  Future visit scheduled no  Notes to clinic Meets protocol but Dr. Althea Charon note states return in 3-6 months.

## 2020-11-26 ENCOUNTER — Other Ambulatory Visit: Payer: Self-pay | Admitting: Family Medicine

## 2020-11-26 DIAGNOSIS — N400 Enlarged prostate without lower urinary tract symptoms: Secondary | ICD-10-CM

## 2020-12-16 ENCOUNTER — Ambulatory Visit (INDEPENDENT_AMBULATORY_CARE_PROVIDER_SITE_OTHER): Payer: Medicare PPO

## 2020-12-16 VITALS — Ht 67.0 in | Wt 120.0 lb

## 2020-12-16 DIAGNOSIS — Z0000 Encounter for general adult medical examination without abnormal findings: Secondary | ICD-10-CM | POA: Diagnosis not present

## 2020-12-16 NOTE — Progress Notes (Signed)
I connected with Dalton Fleming today by telephone and verified that I am speaking with the correct person using two identifiers. Location patient: home Location provider: work Persons participating in the virtual visit: Detrick Dani, Elisha Ponder LPN.   I discussed the limitations, risks, security and privacy concerns of performing an evaluation and management service by telephone and the availability of in person appointments. I also discussed with the patient that there may be a patient responsible charge related to this service. The patient expressed understanding and verbally consented to this telephonic visit.    Interactive audio and video telecommunications were attempted between this provider and patient, however failed, due to patient having technical difficulties OR patient did not have access to video capability.  We continued and completed visit with audio only.     Vital signs may be patient reported or missing.  Subjective:   Dalton Fleming is a 71 y.o. male who presents for Medicare Annual/Subsequent preventive examination.  Review of Systems     Cardiac Risk Factors include: advanced age (>4men, >81 women);male gender;sedentary lifestyle     Objective:    Today's Vitals   12/16/20 1517 12/16/20 1518  Weight: 120 lb (54.4 kg)   Height: 5\' 7"  (1.702 m)   PainSc:  7    Body mass index is 18.79 kg/m.  Advanced Directives 12/16/2020 12/11/2019 08/26/2019 08/25/2019 06/23/2016  Does Patient Have a Medical Advance Directive? No No No No No  Would patient like information on creating a medical advance directive? - - No - Patient declined - No - patient declined information    Current Medications (verified) Outpatient Encounter Medications as of 12/16/2020  Medication Sig  . aspirin EC 81 MG tablet Take 1 tablet (81 mg total) by mouth daily.  . meloxicam (MOBIC) 15 MG tablet TAKE ONE TABLET BY MOUTH EVERY DAY AS NEEDED FOR PAIN (Patient taking differently: Take 15  mg by mouth daily as needed for pain.)  . tamsulosin (FLOMAX) 0.4 MG CAPS capsule TAKE 2 CAPSULES BY MOUTH EVERY DAY   No facility-administered encounter medications on file as of 12/16/2020.    Allergies (verified) Codeine   History: Past Medical History:  Diagnosis Date  . Affective disorder (HCC) 12/12/2012  . Affective disorder, major 07/10/2013  . BPH (benign prostatic hyperplasia)   . Chronic chest wall pain 07/01/2017  . DDD (degenerative disc disease), cervical 12/12/2012  . Depression   . Hx of hepatitis C   . Impotence   . Inability to urinate   . Mononeuropathy of upper extremity 12/12/2012  . Panic disorder without agoraphobia 04/18/2013  . Peyronie's disease   . Rectal polyp 06/09/2016  . Sebaceous cyst 06/09/2016  . Urinary frequency    Past Surgical History:  Procedure Laterality Date  . APPENDECTOMY    . CERVICAL SPINE SURGERY    . HERNIA REPAIR     Family History  Problem Relation Age of Onset  . Heart disease Father   . Breast cancer Sister   . Leukemia Mother   . Diabetes Maternal Grandmother   . Colon cancer Paternal Grandfather   . Heart Problems Maternal Grandfather   . Prostate cancer Neg Hx    Social History   Socioeconomic History  . Marital status: Widowed    Spouse name: Not on file  . Number of children: Not on file  . Years of education: Not on file  . Highest education level: Not on file  Occupational History  . Occupation: retired  Tobacco Use  . Smoking status: Light Tobacco Smoker    Packs/day: 0.50    Years: 20.00    Pack years: 10.00    Last attempt to quit: 06/27/2016    Years since quitting: 4.4  . Smokeless tobacco: Never Used  . Tobacco comment: 1 pack lasts a few days   Vaping Use  . Vaping Use: Never used  Substance and Sexual Activity  . Alcohol use: Yes    Alcohol/week: 0.0 standard drinks    Comment: hard cider occasionally   . Drug use: Yes    Types: Marijuana    Comment: pain control and appetite.   . Sexual  activity: Not Currently  Other Topics Concern  . Not on file  Social History Narrative  . Not on file   Social Determinants of Health   Financial Resource Strain: Low Risk   . Difficulty of Paying Living Expenses: Not hard at all  Food Insecurity: No Food Insecurity  . Worried About Programme researcher, broadcasting/film/video in the Last Year: Never true  . Ran Out of Food in the Last Year: Never true  Transportation Needs: No Transportation Needs  . Lack of Transportation (Medical): No  . Lack of Transportation (Non-Medical): No  Physical Activity: Inactive  . Days of Exercise per Week: 0 days  . Minutes of Exercise per Session: 0 min  Stress: Stress Concern Present  . Feeling of Stress : To some extent  Social Connections: Socially Isolated  . Frequency of Communication with Friends and Family: More than three times a week  . Frequency of Social Gatherings with Friends and Family: Never  . Attends Religious Services: Never  . Active Member of Clubs or Organizations: No  . Attends Banker Meetings: Never  . Marital Status: Widowed    Tobacco Counseling Ready to quit: Not Answered Counseling given: Not Answered Comment: 1 pack lasts a few days    Clinical Intake:  Pre-visit preparation completed: Yes  Pain : 0-10 Pain Score: 7  Pain Type: Chronic pain Pain Location: Back Pain Descriptors / Indicators: Crushing Pain Onset: More than a month ago Pain Frequency: Constant     Nutritional Status: BMI of 19-24  Normal Nutritional Risks: Nausea/ vomitting/ diarrhea (yesterday nausea but resolved) Diabetes: No  How often do you need to have someone help you when you read instructions, pamphlets, or other written materials from your doctor or pharmacy?: 1 - Never What is the last grade level you completed in school?: 10th grade  Diabetic? no  Interpreter Needed?: No  Information entered by :: NAllen LPN   Activities of Daily Living In your present state of health, do you  have any difficulty performing the following activities: 12/16/2020  Hearing? N  Vision? N  Comment small print  Difficulty concentrating or making decisions? N  Walking or climbing stairs? N  Dressing or bathing? N  Doing errands, shopping? Y  Comment family drives  Quarry manager and eating ? N  Using the Toilet? N  In the past six months, have you accidently leaked urine? Y  Do you have problems with loss of bowel control? N  Managing your Medications? N  Managing your Finances? N  Housekeeping or managing your Housekeeping? N  Some recent data might be hidden    Patient Care Team: Smitty Cords, DO as PCP - General (Family Medicine) Marlowe Sax, RN as Case Manager (General Practice)  Indicate any recent Medical Services you may have received from other  than [facility] providers in the past year (date may be approximate).     Assessment:   This is a routine wellness examination for Prentiss.  Hearing/Vision screen No exam data present  Dietary issues and exercise activities discussed: Current Exercise Habits: The patient does not participate in regular exercise at present  Goals    .  Patient Stated      12/16/2020, get out of pain    .  RNCM: Pt-"I have several health problems" (pt-stated)      CARE PLAN ENTRY (see longtitudinal plan of care for additional care plan information)  Current Barriers:  . Chronic Disease Management support, education, and care coordination needs related to pericarditis, DDD, BPH, Affective disorder and anxiety  Clinical Goal(s) related to pericarditis, DDD, BPH, Affective disorder and anxiety:  Over the next 120 days, patient will:  . Work with the care management team to address educational, disease management, and care coordination needs  . Begin or continue self health monitoring activities as directed today  increased incidence of panic attacks and further weight loss . Call provider office for new or worsened signs and  symptoms Weight outside established parameters, Shortness of breath, and New or worsened symptom related to pericarditis, BPH, Affective disorder and anxiety . Call care management team with questions or concerns . Verbalize basic understanding of patient centered plan of care established today  Interventions related to pericarditis, DDD, BPH, Affective disorder and anxiety  . Evaluation of current treatment plans and patient's adherence to plan as established by provider.  The patient states he is having more panic attacks and more frequently.  Discussed if the patient had discussed this with his provider and he said he had. He use to see a pain specialist but stopped. Ask about a referral to a psychiatrist for management of panic attacks but the patient declined.  . Assessed patient understanding of disease states . Assessed patient's education and care coordination needs.  Patient has several chronic conditions and would benefit from the resources available. The patient agrees to work with the [facility]; however declines LCSW or pharmacist support at this time.  . Provided disease specific education to patient  . Collaborated with appropriate clinical care team members regarding patient needs  Patient Self Care Activities related to {pericarditis, DDD, BPH, Affective disorder and anxiety . Patient is unable to independently self-manage chronic health conditions  Initial goal documentation     .  RNCM: Pt-"I need help with nutrition" (pt-stated)      CARE PLAN ENTRY (see longtitudinal plan of care for additional care plan information)  Current Barriers:  Marland Kitchen Knowledge Deficits related to nutritional deficits as evidence of poor appetite and unintentional weight loss.  Darylene Price caregiver support.  . Corporate treasurer.  . Non-adherence to prescribed medication regimen  Nurse Case Manager Clinical Goal(s):  Marland Kitchen Over the next 120 days, patient will verbalize understanding of plan for supplying  Ensure supplements and coupons to help the patient with nutritional status . Over the next 120 days, patient will work with [facility] and pcp to address needs related to Nutritional needs and unintentional weight loss . Over the next 120 days, patient will demonstrate improved health management independence as evidenced byno further weight loss  Interventions:  . Evaluation of current treatment plan related to unintentional weight loss and patient's adherence to plan as established by provider. . Advised patient to try and eat more calories in meals.  The patient needs needs to put on weight. He only  eats one meal a day but does drink supplements when he has them.  . Provided education to patient re: the ability to work with a nutritionist to help with nutrition and unintentional weight loss. The patient declines referral to nutritionist at this time . Discussed plans with patient for ongoing care management follow up and provided patient with direct contact information for care management team . Provided patient and/or caregiver with resources in the community and discussing with pcp information about referrals and food resources to help promote nutrition and healthy weight gain. Patient declines care guide referral  but agrees to pick up Ensure samples from the office.   Patient Self Care Activities:  . Patient verbalizes understanding of plan to pick up Ensure samples from the MD office next week and work with [facility] to meet nutrition and prevent further weight loss . Attends all scheduled provider appointments . Performs ADL's independently . Performs IADL's independently . Calls provider office for new concerns or questions . Unable to independently manage nutritional status.  . Unable to self administer medications as prescribed . Does not contact provider office for questions/concerns  Initial goal documentation       Depression Screen PHQ 2/9 Scores 12/16/2020 01/24/2020 12/14/2019 12/11/2019  06/20/2018 06/10/2016  PHQ - 2 Score 0 3 3 1 1 6   PHQ- 9 Score - 6 3 - 8 22    Fall Risk Fall Risk  12/16/2020 12/11/2019 06/20/2018 06/10/2016  Falls in the past year? 0 0 No Yes  Number falls in past yr: - 0 - 2 or more  Injury with Fall? - 0 - -  Risk for fall due to : Medication side effect - - -  Follow up Falls evaluation completed;Education provided;Falls prevention discussed - - -    FALL RISK PREVENTION PERTAINING TO THE HOME:  Any stairs in or around the home? Yes  If so, are there any without handrails? No  Home free of loose throw rugs in walkways, pet beds, electrical cords, etc? Yes  Adequate lighting in your home to reduce risk of falls? Yes   ASSISTIVE DEVICES UTILIZED TO PREVENT FALLS:  Life alert? No  Use of a cane, walker or w/c? No  Grab bars in the bathroom? Yes  Shower chair or bench in shower? Yes  Elevated toilet seat or a handicapped toilet? No   TIMED UP AND GO:  Was the test performed? No .    Cognitive Function:     6CIT Screen 12/16/2020  What Year? 0 points  What month? 0 points  What time? 0 points  Count back from 20 0 points  Months in reverse 2 points  Repeat phrase 0 points  Total Score 2    Immunizations Immunization History  Administered Date(s) Administered  . Influenza,inj,Quad PF,6+ Mos 07/01/2017  . Pneumococcal Conjugate-13 07/01/2017    TDAP status: Due, Education has been provided regarding the importance of this vaccine. Advised may receive this vaccine at local pharmacy or Health Dept. Aware to provide a copy of the vaccination record if obtained from local pharmacy or Health Dept. Verbalized acceptance and understanding.  Flu Vaccine status: Declined, Education has been provided regarding the importance of this vaccine but patient still declined. Advised may receive this vaccine at local pharmacy or Health Dept. Aware to provide a copy of the vaccination record if obtained from local pharmacy or Health Dept. Verbalized  acceptance and understanding.  Pneumococcal vaccine status: Declined,  Education has been provided regarding the importance of  this vaccine but patient still declined. Advised may receive this vaccine at local pharmacy or Health Dept. Aware to provide a copy of the vaccination record if obtained from local pharmacy or Health Dept. Verbalized acceptance and understanding.   Covid-19 vaccine status: Declined, Education has been provided regarding the importance of this vaccine but patient still declined. Advised may receive this vaccine at local pharmacy or Health Dept.or vaccine clinic. Aware to provide a copy of the vaccination record if obtained from local pharmacy or Health Dept. Verbalized acceptance and understanding.  Qualifies for Shingles Vaccine? Yes   Zostavax completed No   Shingrix Completed?: No.    Education has been provided regarding the importance of this vaccine. Patient has been advised to call insurance company to determine out of pocket expense if they have not yet received this vaccine. Advised may also receive vaccine at local pharmacy or Health Dept. Verbalized acceptance and understanding.  Screening Tests Health Maintenance  Topic Date Due  . Hepatitis C Screening  Never done  . COVID-19 Vaccine (1) Never done  . TETANUS/TDAP  Never done  . PNA vac Low Risk Adult (2 of 2 - PPSV23) 07/01/2018  . INFLUENZA VACCINE  05/25/2020  . COLONOSCOPY (Pts 45-368yrs Insurance coverage will need to be confirmed)  09/17/2024    Health Maintenance  Health Maintenance Due  Topic Date Due  . Hepatitis C Screening  Never done  . COVID-19 Vaccine (1) Never done  . TETANUS/TDAP  Never done  . PNA vac Low Risk Adult (2 of 2 - PPSV23) 07/01/2018  . INFLUENZA VACCINE  05/25/2020    Colorectal cancer screening: Type of screening: Colonoscopy. Completed 09/17/2014. Repeat every 10 years  Lung Cancer Screening: (Low Dose CT Chest recommended if Age 20-80 years, 30 pack-year currently  smoking OR have quit w/in 15years.) does not qualify.   Lung Cancer Screening Referral: no  Additional Screening:  Hepatitis C Screening: does qualify; due  Vision Screening: Recommended annual ophthalmology exams for early detection of glaucoma and other disorders of the eye. Is the patient up to date with their annual eye exam?  No  Who is the provider or what is the name of the office in which the patient attends annual eye exams? Dr.Nice If pt is not established with a provider, would they like to be referred to a provider to establish care? No .   Dental Screening: Recommended annual dental exams for proper oral hygiene  Community Resource Referral / Chronic Care Management: CRR required this visit?  No   CCM required this visit?  No      Plan:     I have personally reviewed and noted the following in the patient's chart:   . Medical and social history . Use of alcohol, tobacco or illicit drugs  . Current medications and supplements . Functional ability and status . Nutritional status . Physical activity . Advanced directives . List of other physicians . Hospitalizations, surgeries, and ER visits in previous 12 months . Vitals . Screenings to include cognitive, depression, and falls . Referrals and appointments  In addition, I have reviewed and discussed with patient certain preventive protocols, quality metrics, and best practice recommendations. A written personalized care plan for preventive services as well as general preventive health recommendations were provided to patient.     Barb Merinoickeah E Allen, LPN   1/61/09602/22/2022   Nurse Notes:

## 2020-12-16 NOTE — Patient Instructions (Signed)
Dalton Fleming , Thank you for taking time to come for your Medicare Wellness Visit. I appreciate your ongoing commitment to your health goals. Please review the following plan we discussed and let me know if I can assist you in the future.   Screening recommendations/referrals: Colonoscopy: completed 09/17/2014 Recommended yearly ophthalmology/optometry visit for glaucoma screening and checkup Recommended yearly dental visit for hygiene and checkup  Vaccinations: Influenza vaccine: decline Pneumococcal vaccine: decline Tdap vaccine: decline Shingles vaccine: decline   Covid-19:  decline  Advanced directives: Advance directive discussed with you today.  Conditions/risks identified: smoking  Next appointment: Follow up in one year for your annual wellness visit.   Preventive Care 37 Years and Older, Male Preventive care refers to lifestyle choices and visits with your health care provider that can promote health and wellness. What does preventive care include?  A yearly physical exam. This is also called an annual well check.  Dental exams once or twice a year.  Routine eye exams. Ask your health care provider how often you should have your eyes checked.  Personal lifestyle choices, including:  Daily care of your teeth and gums.  Regular physical activity.  Eating a healthy diet.  Avoiding tobacco and drug use.  Limiting alcohol use.  Practicing safe sex.  Taking low doses of aspirin every day.  Taking vitamin and mineral supplements as recommended by your health care provider. What happens during an annual well check? The services and screenings done by your health care provider during your annual well check will depend on your age, overall health, lifestyle risk factors, and family history of disease. Counseling  Your health care provider may ask you questions about your:  Alcohol use.  Tobacco use.  Drug use.  Emotional well-being.  Home and relationship  well-being.  Sexual activity.  Eating habits.  History of falls.  Memory and ability to understand (cognition).  Work and work Astronomer. Screening  You may have the following tests or measurements:  Height, weight, and BMI.  Blood pressure.  Lipid and cholesterol levels. These may be checked every 5 years, or more frequently if you are over 15 years old.  Skin check.  Lung cancer screening. You may have this screening every year starting at age 63 if you have a 30-pack-year history of smoking and currently smoke or have quit within the past 15 years.  Fecal occult blood test (FOBT) of the stool. You may have this test every year starting at age 28.  Flexible sigmoidoscopy or colonoscopy. You may have a sigmoidoscopy every 5 years or a colonoscopy every 10 years starting at age 20.  Prostate cancer screening. Recommendations will vary depending on your family history and other risks.  Hepatitis C blood test.  Hepatitis B blood test.  Sexually transmitted disease (STD) testing.  Diabetes screening. This is done by checking your blood sugar (glucose) after you have not eaten for a while (fasting). You may have this done every 1-3 years.  Abdominal aortic aneurysm (AAA) screening. You may need this if you are a current or former smoker.  Osteoporosis. You may be screened starting at age 34 if you are at high risk. Talk with your health care provider about your test results, treatment options, and if necessary, the need for more tests. Vaccines  Your health care provider may recommend certain vaccines, such as:  Influenza vaccine. This is recommended every year.  Tetanus, diphtheria, and acellular pertussis (Tdap, Td) vaccine. You may need a Td booster every 10  years.  Zoster vaccine. You may need this after age 32.  Pneumococcal 13-valent conjugate (PCV13) vaccine. One dose is recommended after age 27.  Pneumococcal polysaccharide (PPSV23) vaccine. One dose is  recommended after age 67. Talk to your health care provider about which screenings and vaccines you need and how often you need them. This information is not intended to replace advice given to you by your health care provider. Make sure you discuss any questions you have with your health care provider. Document Released: 11/07/2015 Document Revised: 06/30/2016 Document Reviewed: 08/12/2015 Elsevier Interactive Patient Education  2017 [place] Prevention in the Home Falls can cause injuries. They can happen to people of all ages. There are many things you can do to make your home safe and to help prevent falls. What can I do on the outside of my home?  Regularly fix the edges of walkways and driveways and fix any cracks.  Remove anything that might make you trip as you walk through a door, such as a raised step or threshold.  Trim any bushes or trees on the path to your home.  Use bright outdoor lighting.  Clear any walking paths of anything that might make someone trip, such as rocks or tools.  Regularly check to see if handrails are loose or broken. Make sure that both sides of any steps have handrails.  Any raised decks and porches should have guardrails on the edges.  Have any leaves, snow, or ice cleared regularly.  Use sand or salt on walking paths during winter.  Clean up any spills in your garage right away. This includes oil or grease spills. What can I do in the bathroom?  Use night lights.  Install grab bars by the toilet and in the tub and shower. Do not use towel bars as grab bars.  Use non-skid mats or decals in the tub or shower.  If you need to sit down in the shower, use a plastic, non-slip stool.  Keep the floor dry. Clean up any water that spills on the floor as soon as it happens.  Remove soap buildup in the tub or shower regularly.  Attach bath mats securely with double-sided non-slip rug tape.  Do not have throw rugs and other things on  the floor that can make you trip. What can I do in the bedroom?  Use night lights.  Make sure that you have a light by your bed that is easy to reach.  Do not use any sheets or blankets that are too big for your bed. They should not hang down onto the floor.  Have a firm chair that has side arms. You can use this for support while you get dressed.  Do not have throw rugs and other things on the floor that can make you trip. What can I do in the kitchen?  Clean up any spills right away.  Avoid walking on wet floors.  Keep items that you use a lot in easy-to-reach places.  If you need to reach something above you, use a strong step stool that has a grab bar.  Keep electrical cords out of the way.  Do not use floor polish or wax that makes floors slippery. If you must use wax, use non-skid floor wax.  Do not have throw rugs and other things on the floor that can make you trip. What can I do with my stairs?  Do not leave any items on the stairs.  Make sure that  there are handrails on both sides of the stairs and use them. Fix handrails that are broken or loose. Make sure that handrails are as long as the stairways.  Check any carpeting to make sure that it is firmly attached to the stairs. Fix any carpet that is loose or worn.  Avoid having throw rugs at the top or bottom of the stairs. If you do have throw rugs, attach them to the floor with carpet tape.  Make sure that you have a light switch at the top of the stairs and the bottom of the stairs. If you do not have them, ask someone to add them for you. What else can I do to help prevent falls?  Wear shoes that:  Do not have high heels.  Have rubber bottoms.  Are comfortable and fit you well.  Are closed at the toe. Do not wear sandals.  If you use a stepladder:  Make sure that it is fully opened. Do not climb a closed stepladder.  Make sure that both sides of the stepladder are locked into place.  Ask someone to  hold it for you, if possible.  Clearly mark and make sure that you can see:  Any grab bars or handrails.  First and last steps.  Where the edge of each step is.  Use tools that help you move around (mobility aids) if they are needed. These include:  Canes.  Walkers.  Scooters.  Crutches.  Turn on the lights when you go into a dark area. Replace any light bulbs as soon as they burn out.  Set up your furniture so you have a clear path. Avoid moving your furniture around.  If any of your floors are uneven, fix them.  If there are any pets around you, be aware of where they are.  Review your medicines with your doctor. Some medicines can make you feel dizzy. This can increase your chance of falling. Ask your doctor what other things that you can do to help prevent falls. This information is not intended to replace advice given to you by your health care provider. Make sure you discuss any questions you have with your health care provider. Document Released: 08/07/2009 Document Revised: 03/18/2016 Document Reviewed: 11/15/2014 Elsevier Interactive Patient Education  2017 [employer].

## 2021-03-05 ENCOUNTER — Other Ambulatory Visit: Payer: Self-pay | Admitting: Family Medicine

## 2021-03-05 DIAGNOSIS — N400 Enlarged prostate without lower urinary tract symptoms: Secondary | ICD-10-CM

## 2021-03-05 NOTE — Telephone Encounter (Signed)
Requested medications are due for refill today.  yes  Requested medications are on the active medications list.  yes  Last refill. 11/26/2020  Future visit scheduled.   yes  Notes to clinic.  Pt is more than 3 months overdue for office visit.

## 2021-04-10 ENCOUNTER — Other Ambulatory Visit: Payer: Self-pay | Admitting: Family Medicine

## 2021-04-10 DIAGNOSIS — N400 Enlarged prostate without lower urinary tract symptoms: Secondary | ICD-10-CM

## 2021-04-10 NOTE — Telephone Encounter (Signed)
   Notes to clinic: Patient has medicare wellness on 12/16/2020 Review for refill Protocol failed : no valid encounter within 12 months   Requested Prescriptions  Pending Prescriptions Disp Refills   tamsulosin (FLOMAX) 0.4 MG CAPS capsule [Pharmacy Med Name: TAMSULOSIN HCL 0.4 MG CAPSULE] 180 capsule 0    Sig: TAKE 2 CAPSULES BY MOUTH EVERY DAY      Urology: Alpha-Adrenergic Blocker Failed - 04/10/2021  9:10 AM      Failed - Valid encounter within last 12 months    Recent Outpatient Visits           1 year ago Benign prostatic hyperplasia without lower urinary tract symptoms   [facility] [place], Netta Neat, DO   2 years ago Chronic chest wall pain   [facility] [place], Netta Neat, DO   3 years ago Chronic chest wall pain   [facility] Smitty Cords, DO   4 years ago Chest discomfort   [facility] Janeann Forehand., MD       Future Appointments             In 8 months [facility]              Passed - Last BP in normal range    BP Readings from Last 1 Encounters:  08/27/19 117/81

## 2021-05-05 ENCOUNTER — Other Ambulatory Visit: Payer: Self-pay | Admitting: Family Medicine

## 2021-05-05 DIAGNOSIS — N400 Enlarged prostate without lower urinary tract symptoms: Secondary | ICD-10-CM

## 2021-05-05 NOTE — Telephone Encounter (Signed)
  Notes to clinic:  script last filled on 11/26/2020 Review for continued use and refill    Requested Prescriptions  Pending Prescriptions Disp Refills   tamsulosin (FLOMAX) 0.4 MG CAPS capsule [Pharmacy Med Name: TAMSULOSIN HCL 0.4 MG CAPSULE] 180 capsule 0    Sig: TAKE 2 CAPSULES BY MOUTH EVERY DAY      Urology: Alpha-Adrenergic Blocker Failed - 05/05/2021  8:17 AM      Failed - Valid encounter within last 12 months    Recent Outpatient Visits           1 year ago Benign prostatic hyperplasia without lower urinary tract symptoms   [facility] [place], Netta Neat, DO   2 years ago Chronic chest wall pain   [facility] [place], Netta Neat, DO   3 years ago Chronic chest wall pain   [facility] Smitty Cords, DO   4 years ago Chest discomfort   [facility] Janeann Forehand., MD       Future Appointments             In 7 months [facility]              Passed - Last BP in normal range    BP Readings from Last 1 Encounters:  08/27/19 117/81

## 2021-12-22 ENCOUNTER — Ambulatory Visit: Payer: Medicare PPO
